# Patient Record
Sex: Female | Born: 1985 | Race: White | Hispanic: No | Marital: Married
Health system: Southern US, Academic
[De-identification: ages and names within clinical notes are randomized; demographics above are authoritative.]

---
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
## 2019-01-27 IMAGING — MR MRI BRAIN WITHOUT AND WITH CONTRAST
12 of 16 series · 36 of 48 positions shown · IV contrast (gadavist)
Comparison: None available.

EXAM:  MRI BRAIN WITHOUT AND WITH CONTRAST
INDICATION: Optic neuritis.
TECHNIQUE: Multiplanar multisequential MRI of the brain and orbits was performed without and with 8 mL of Gadavist.

[Series 11: DWI · axial · 5.0mm · 1.35mm/px · z∈[-41,+85]mm · 9 of 88 slices shown (1 of 3)]
[im 1/88]
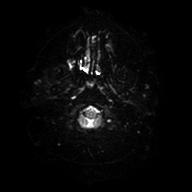
[im 11/88]
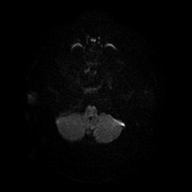
[im 22/88]
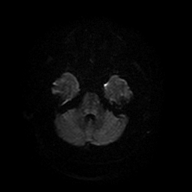
[im 33/88]
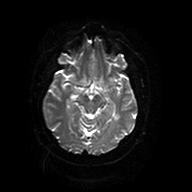
[im 44/88]
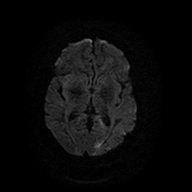
[im 55/88]
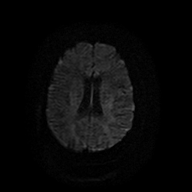
[im 66/88]
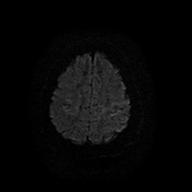
[im 77/88]
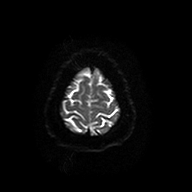
[im 88/88]
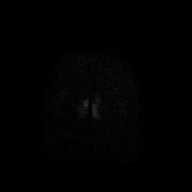

[Series 12: DWI · axial · 5.0mm · 1.35mm/px · z∈[-41,+85]mm · 2 of 22 slices shown (2 of 3)]
[im 1/22]
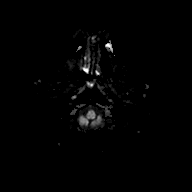
[im 22/22]
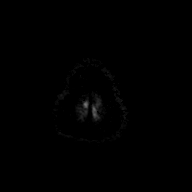

[Series 13: DWI · axial · 5.0mm · 1.35mm/px · z∈[-41,+85]mm · 2 of 22 slices shown (3 of 3)]
[im 1/22]
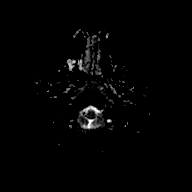
[im 22/22]
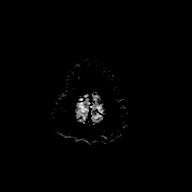

[Series 14: FLAIR · sagittal · 4.0mm · 0.75mm/px · 3 of 26 slices shown (1 of 2)]
[im 1/26]
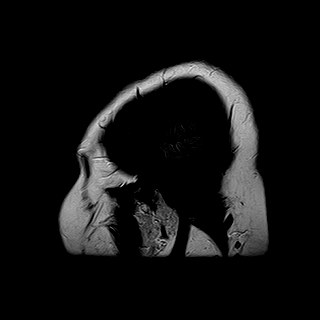
[im 13/26]
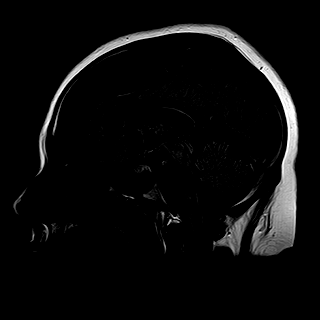
[im 26/26]
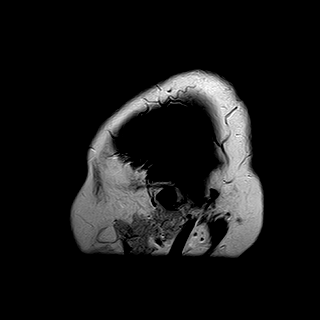

[Series 15: T2 · axial · 5.0mm · 0.43mm/px · z∈[-45,+99]mm · 3 of 25 slices shown]
[im 1/25]
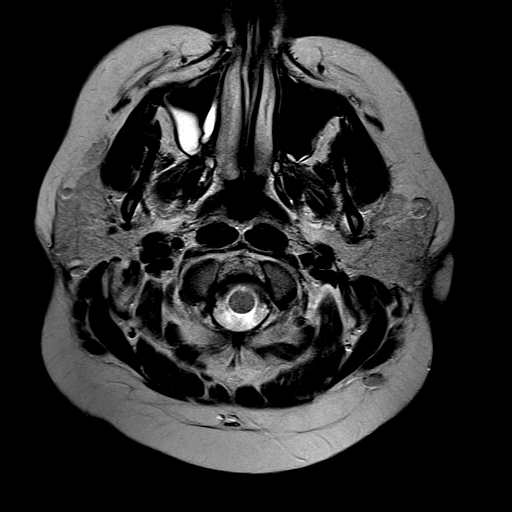
[im 13/25]
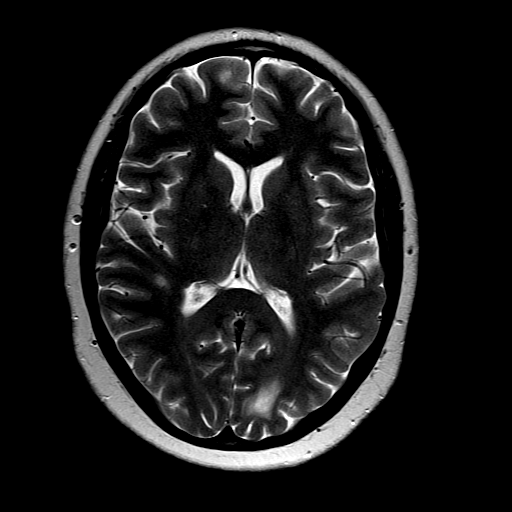
[im 25/25]
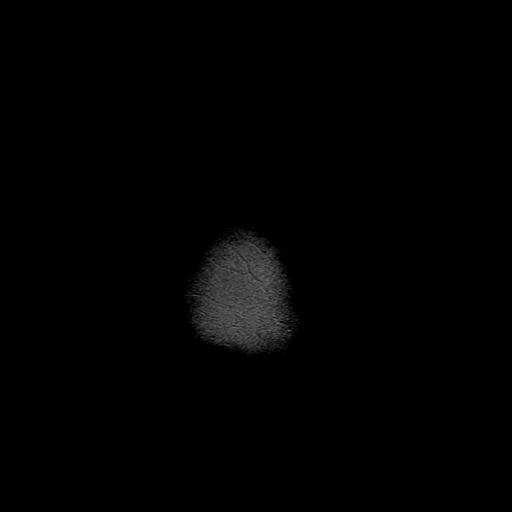

[Series 16: FLAIR · axial · 5.0mm · 0.43mm/px · z∈[-45,+99]mm · 3 of 25 slices shown (2 of 2)]
[im 1/25]
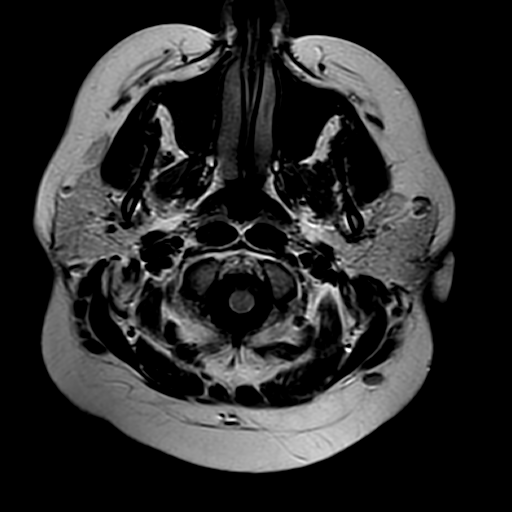
[im 13/25]
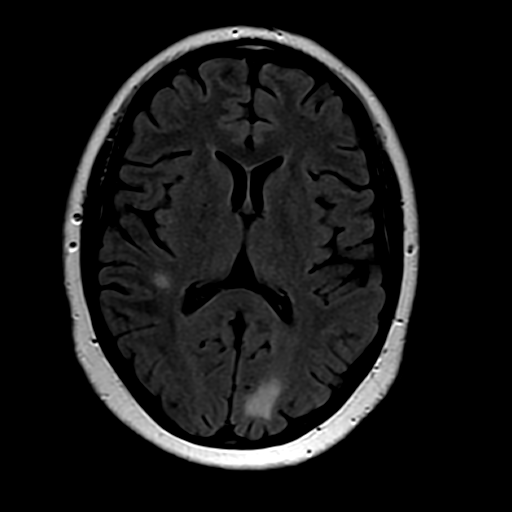
[im 25/25]
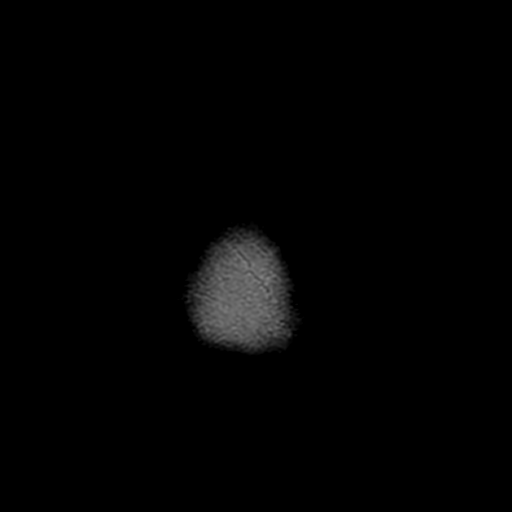

[Series 17: T1 · axial · 5.0mm · 0.43mm/px · z∈[-45,+99]mm · 3 of 25 slices shown]
[im 1/25]
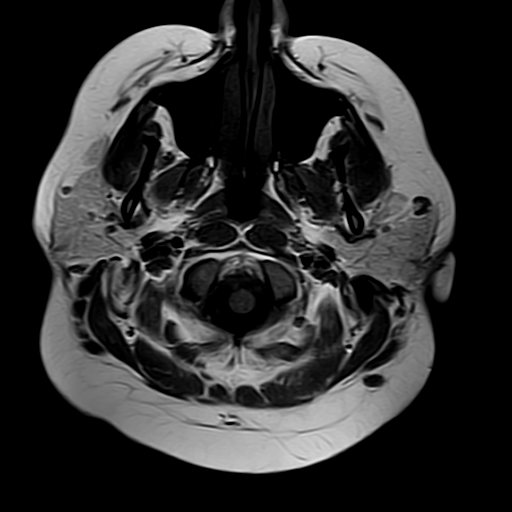
[im 13/25]
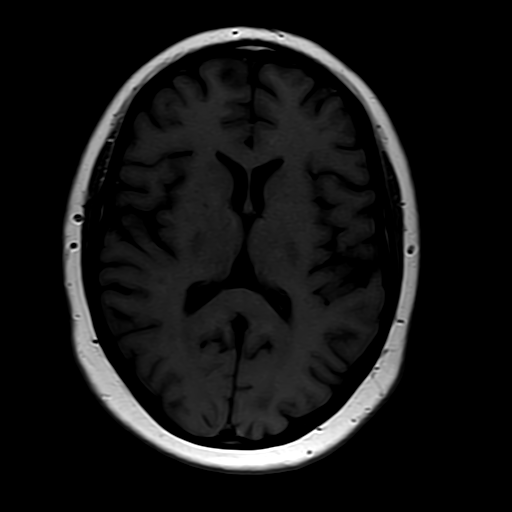
[im 25/25]
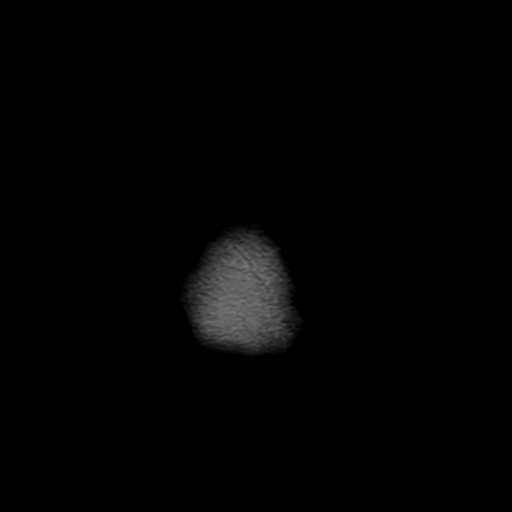

[Series 19: T1 fat-sat · axial · 3.0mm · 0.56mm/px · z∈[-35,+11]mm · 2 of 15 slices shown (1 of 4)]
[im 1/15]
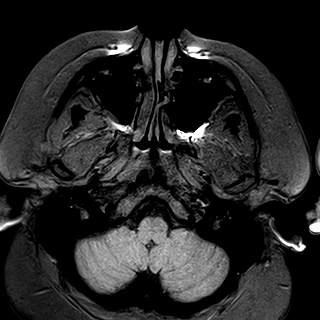
[im 15/15]
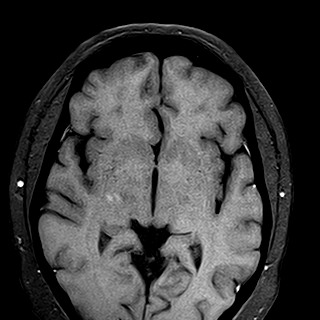

[Series 20: T1 fat-sat · coronal · 3.5mm · 0.56mm/px · 2 of 18 slices shown (2 of 4)]
[im 1/18]
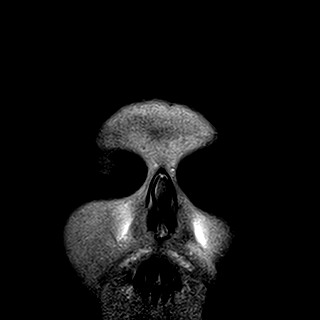
[im 18/18]
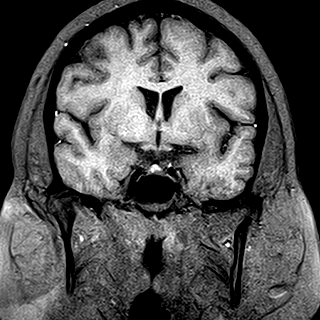

[Series 21: T1 fat-sat · coronal · 6.0mm · 0.57mm/px · 3 of 24 slices shown (3 of 4)]
[im 1/24]
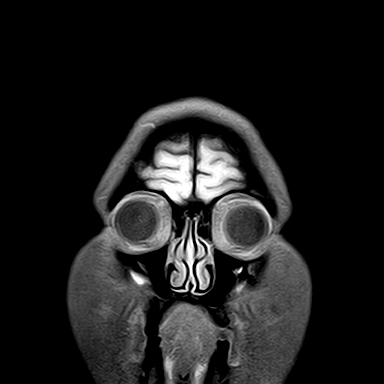
[im 12/24]
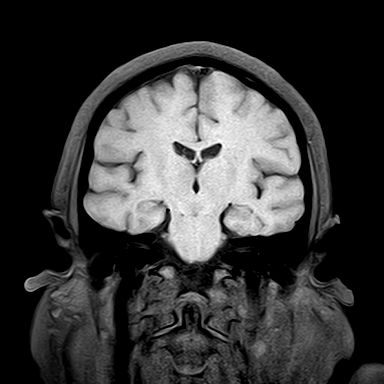
[im 24/24]
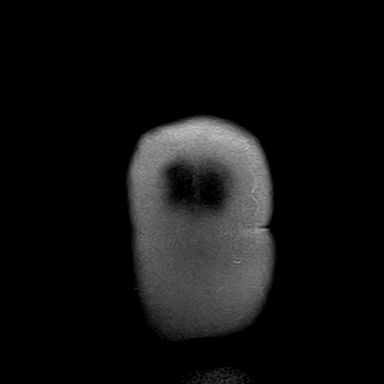

[Series 22: T1 fat-sat · axial · 5.0mm · 0.43mm/px · z∈[-45,+99]mm · 3 of 25 slices shown (4 of 4)]
[im 1/25]
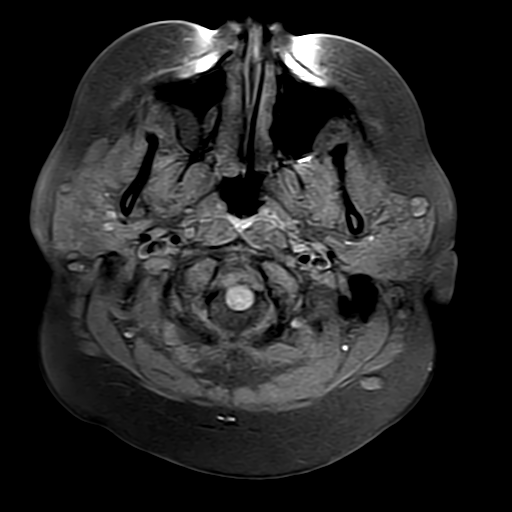
[im 13/25]
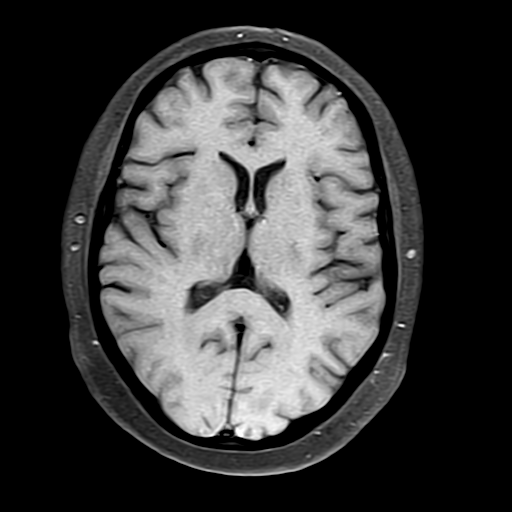
[im 25/25]
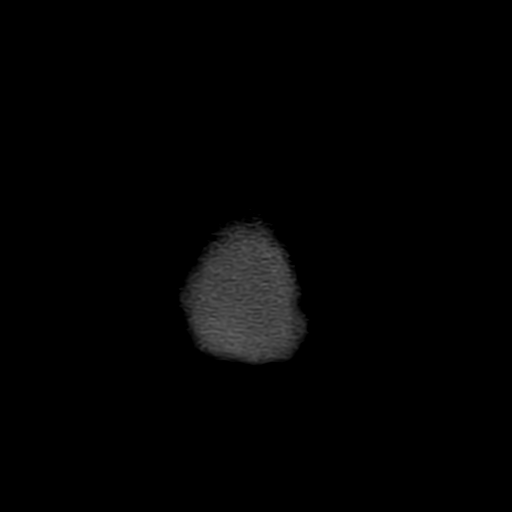

[Series 23: T1 fat-sat post-contrast · axial · 3.0mm · 0.56mm/px · 1 of 15 slices shown]
[im 1/15]
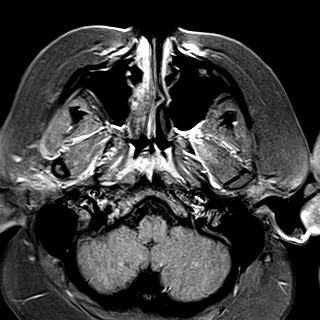

[36 of 48 positions shown; findings below may reference images not displayed]

FINDINGS: Ventricular and sulcal size is normal for the patient's age. There are approximately 6-8 foci of abnormal T2/FLAIR signal hyperintensity within the periventricular and subcortical deep white matter of each cerebral hemisphere. A 3.7 mm lesion is also noted within the medulla, slightly to the left of the midline. Largest lesion measures 2 cm within the subcortical left occipital lobe with mild diffusion restriction and enhancement. Faint enhancement is also noted within a 4.5 mm lesion within the superior right frontal lobe. There is no mass effect, midline shift or intracranial hemorrhage. There is no evidence of acute infarction or prior microhemorrhages. Skull base flow voids and basal cisterns are patent. Sagittal survey of midline structures is unremarkable. Orbital contents are normal. Faint enhancement of the left optic nerve, close to the optic radiation, is also seen. There are no extra-axial fluid collections. Visualized paranasal sinuses and mastoid air cells are also unremarkable.
IMPRESSION: Imaging findings compatible with multiple sclerosis. There is evidence of active demyelination and left-sided optic neuritis.

## 2019-06-23 IMAGING — MR MRI CERVICAL SPINE WITHOUT AND WITH CONTRAST
5 of 8 series · 30 of 48 positions shown · IV contrast (gadavist)
Comparison: None available.

EXAM:  MRI CERVICAL SPINE WITHOUT AND WITH CONTRAST
INDICATION: Multiple sclerosis.
TECHNIQUE: Multiplanar multisequential MRI of the cervical spine was performed without and with 8 mL of Gadavist.

[Series 10: T2 · sagittal · 3.0mm · 0.75mm/px · 5 of 13 slices shown (1 of 2)]
[im 1/13]
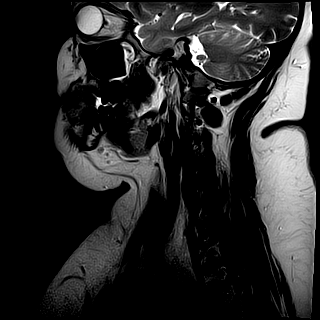
[im 4/13]
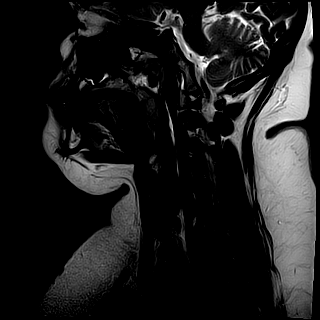
[im 7/13]
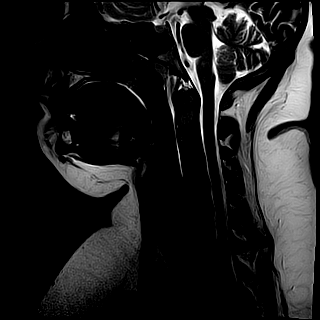
[im 10/13]
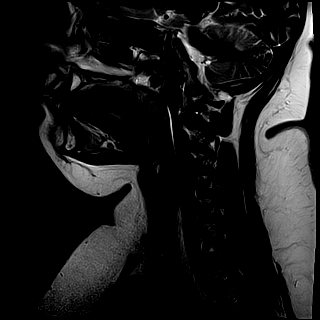
[im 13/13]
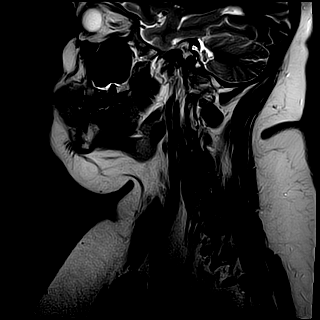

[Series 11: T1 · sagittal · 3.0mm · 0.47mm/px · 4 of 13 slices shown]
[im 1/13]
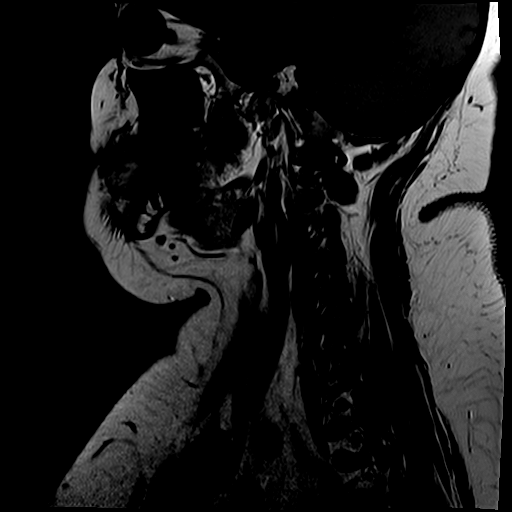
[im 5/13]
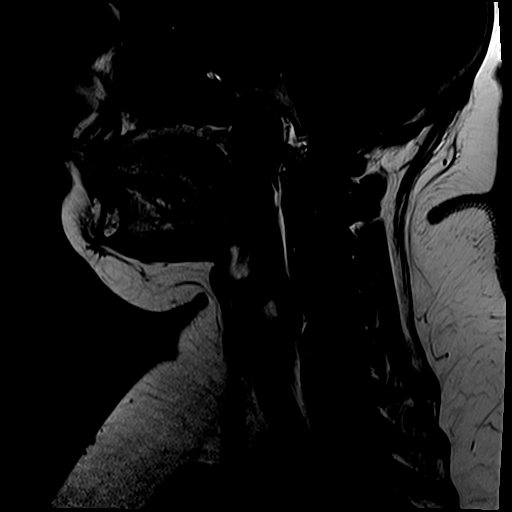
[im 9/13]
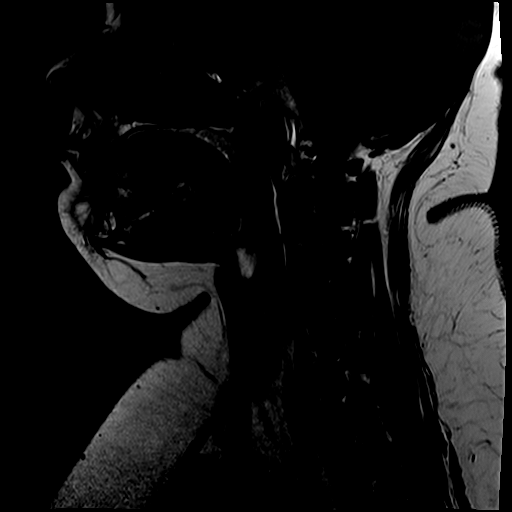
[im 13/13]
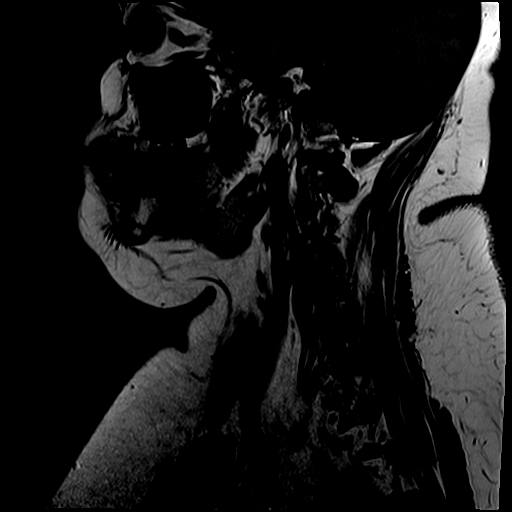

[Series 12: T1 fat-sat · sagittal · 3.0mm · 0.75mm/px · 4 of 13 slices shown (1 of 2)]
[im 1/13]
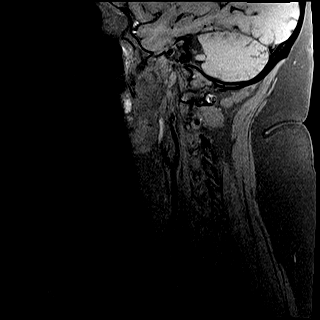
[im 5/13]
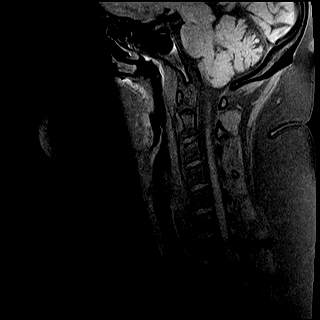
[im 9/13]
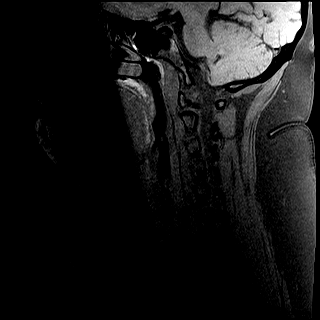
[im 13/13]
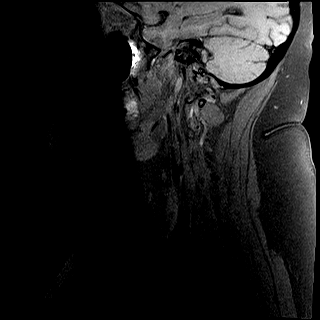

[Series 14: T2 · axial · 3.5mm · 0.35mm/px · z∈[-95,+17]mm · 9 of 26 slices shown (2 of 2)]
[im 1/26]
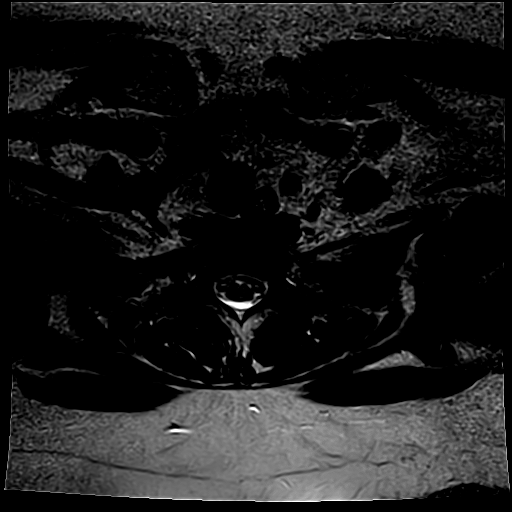
[im 4/26]
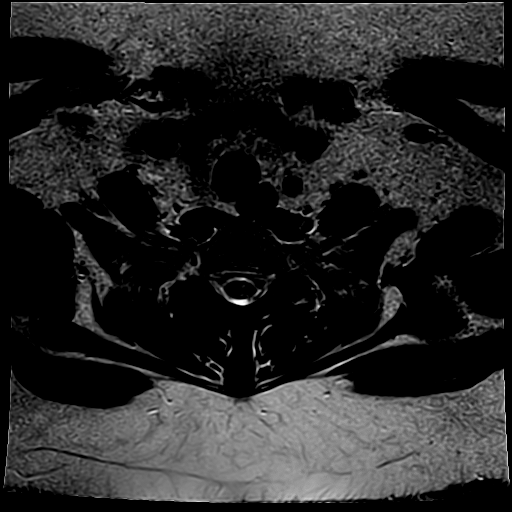
[im 7/26]
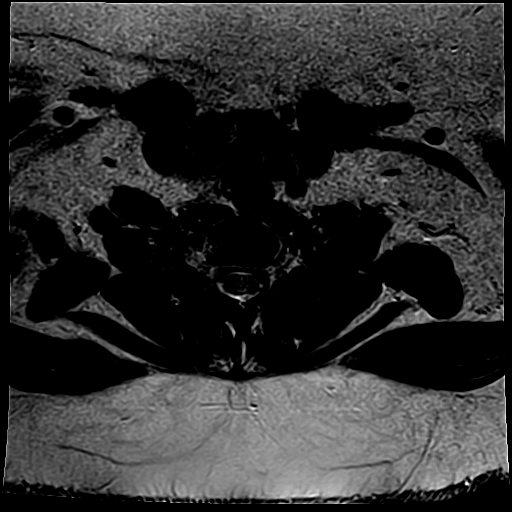
[im 10/26]
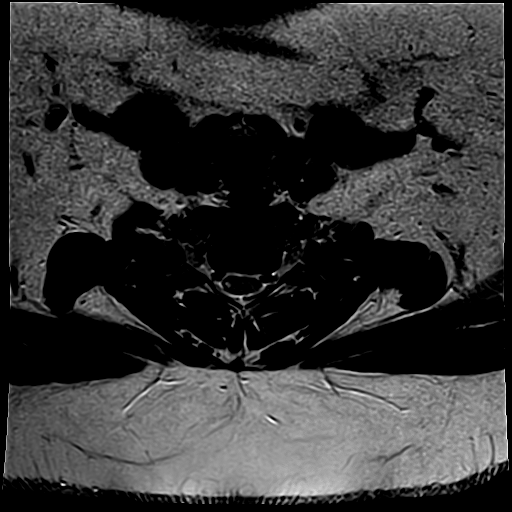
[im 13/26]
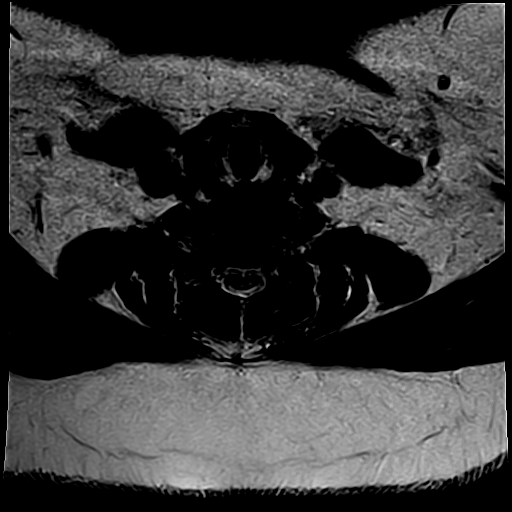
[im 16/26]
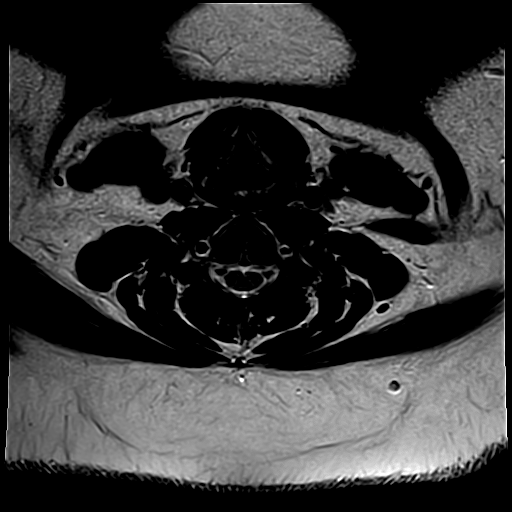
[im 19/26]
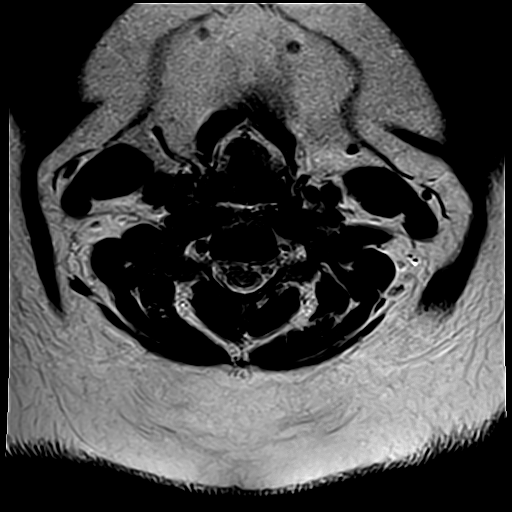
[im 22/26]
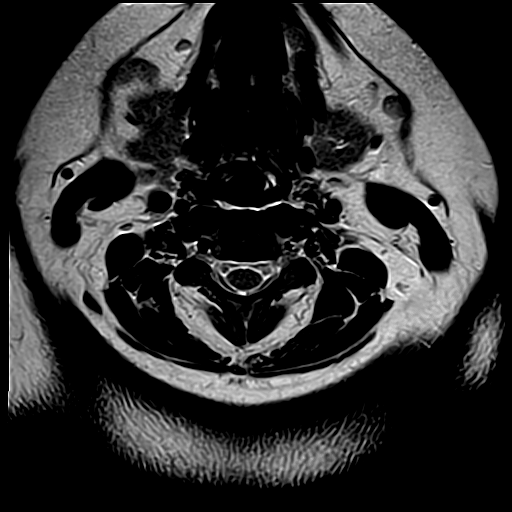
[im 26/26]
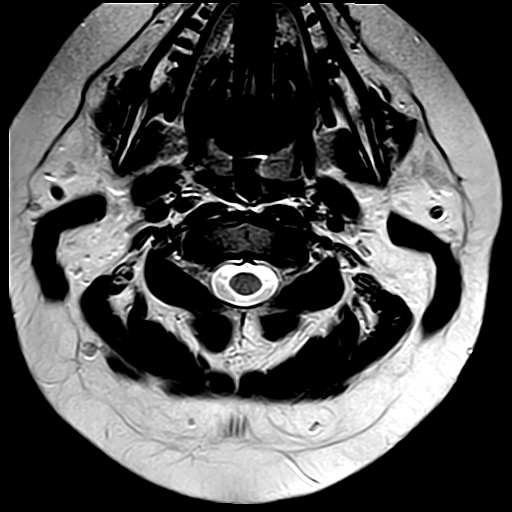

[Series 15: T1 fat-sat · axial · 3.5mm · 0.70mm/px · z∈[-95,+17]mm · 8 of 26 slices shown (2 of 2)]
[im 1/26]
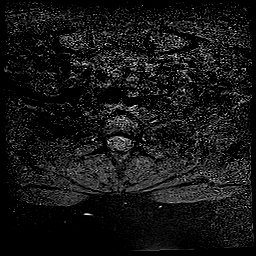
[im 4/26]
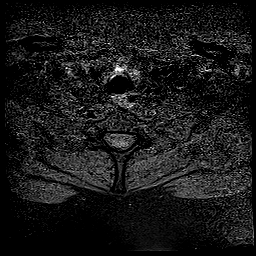
[im 7/26]
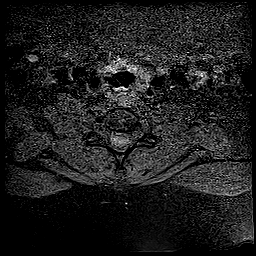
[im 10/26]
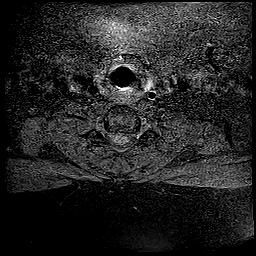
[im 16/26]
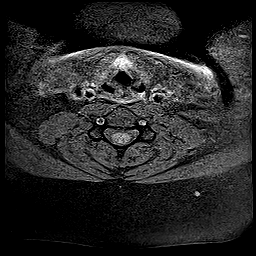
[im 19/26]
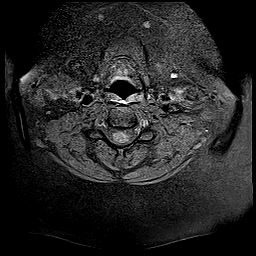
[im 22/26]
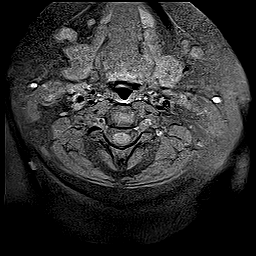
[im 26/26]
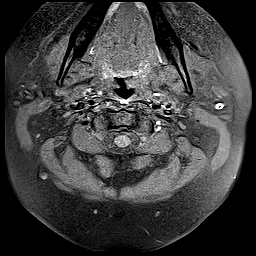

[30 of 48 positions shown; findings below may reference images not displayed]

FINDINGS: Straightening of cervical lordosis is likely positional. Bone marrow signal intensity is normal. There is no acute fracture or subluxation. 

There is a 5.5 mm faint T2 signal hyperintense lesion within the spinal cord at the level of C2 vertebral body. This is best seen on the STIR sequence. There is another 6.5 mm faintly enhancing lesion within the right aspect of the spinal cord at the level of C6 vertebral body. There is no significant disc herniation, spinal canal or neural foraminal stenosis at any level. Paraspinal soft tissues are unremarkable.
IMPRESSION: Two small lesions within the cervical spinal cord as detailed above. There is faint enhancement within a 6.5 mm plaque at the level of C6 vertebral body, suggestive of active demyelination. No significant degenerative changes.

## 2019-06-23 IMAGING — MR MRI THORACIC SPINE WITHOUT AND WITH CONTRAST
6 of 10 series · 30 of 48 positions shown · IV contrast (gadavist)
Comparison: None available.

EXAM:  MRI THORACIC SPINE WITHOUT AND WITH CONTRAST
INDICATION: Multiple sclerosis.
TECHNIQUE: Multiplanar multisequential MRI of the thoracic spine was performed without and with 8 mL of Gadavist.

[Series 6: T2 · sagittal · 5.0mm · 0.78mm/px · 5 of 11 slices shown (1 of 2)]
[im 1/11]
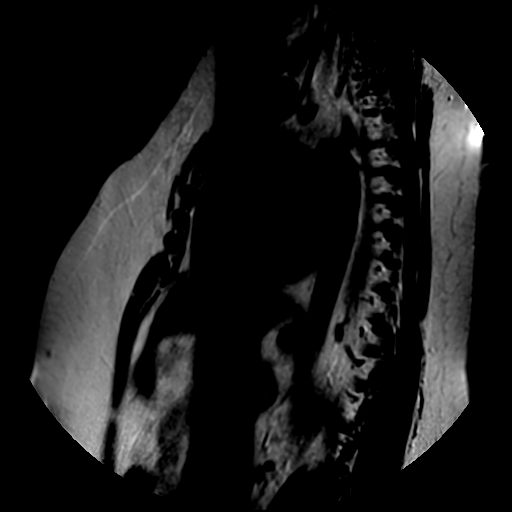
[im 3/11]
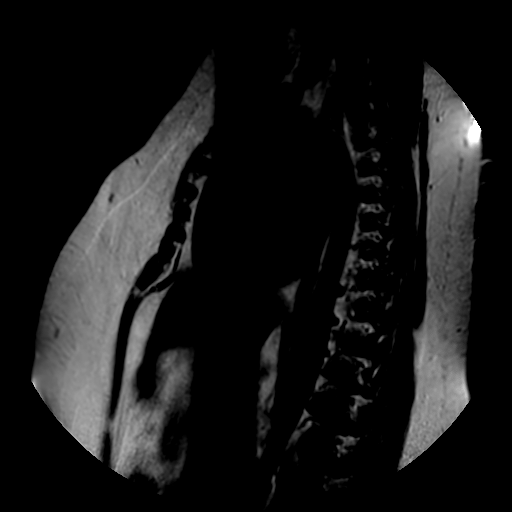
[im 6/11]
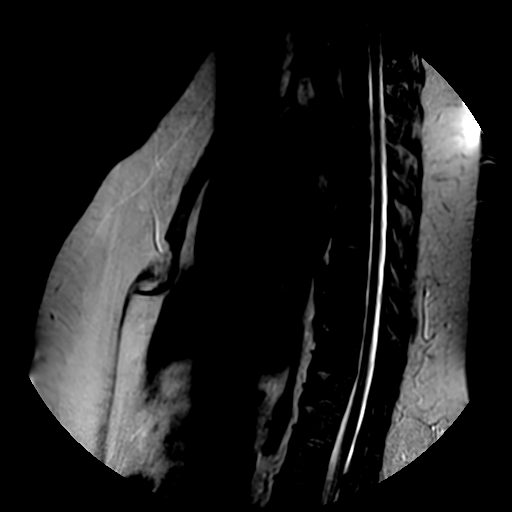
[im 8/11]
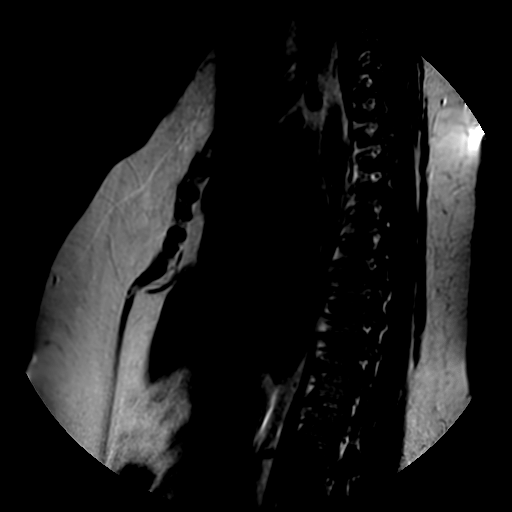
[im 11/11]
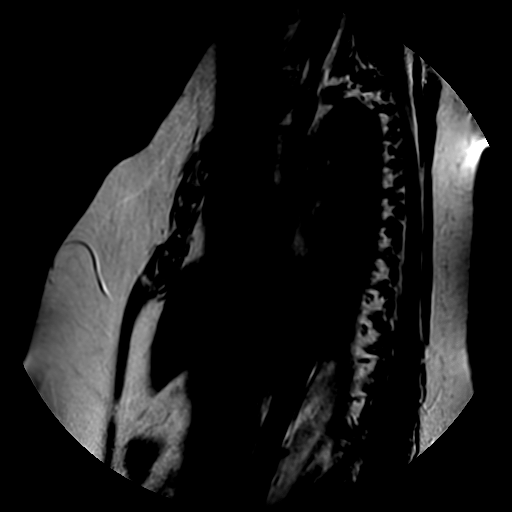

[Series 7: T1 · sagittal · 5.0mm · 0.78mm/px · 5 of 11 slices shown (1 of 2)]
[im 1/11]
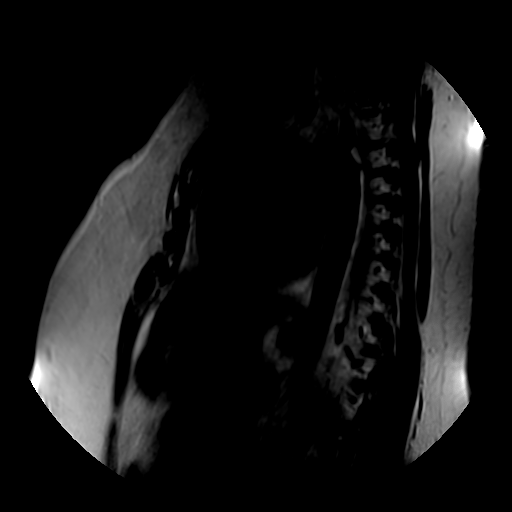
[im 3/11]
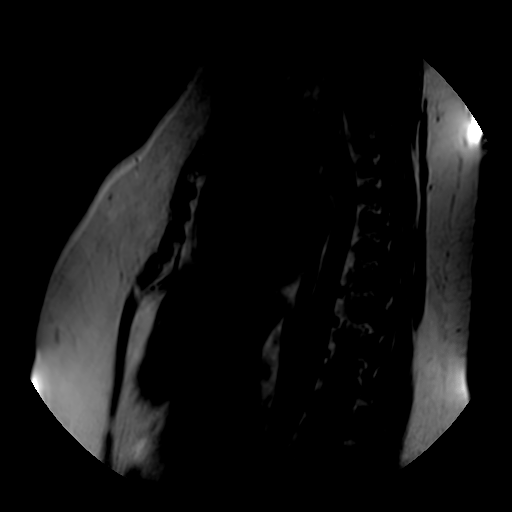
[im 6/11]
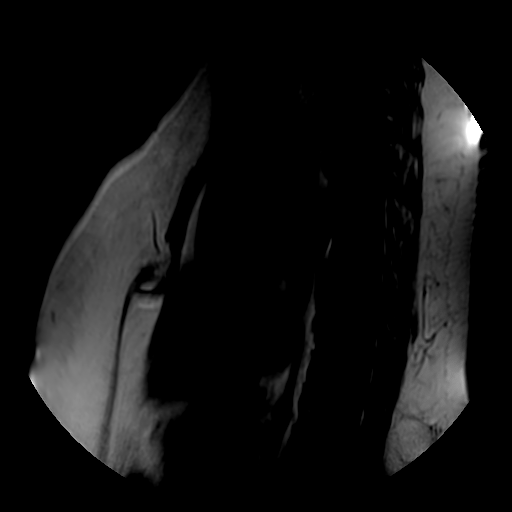
[im 8/11]
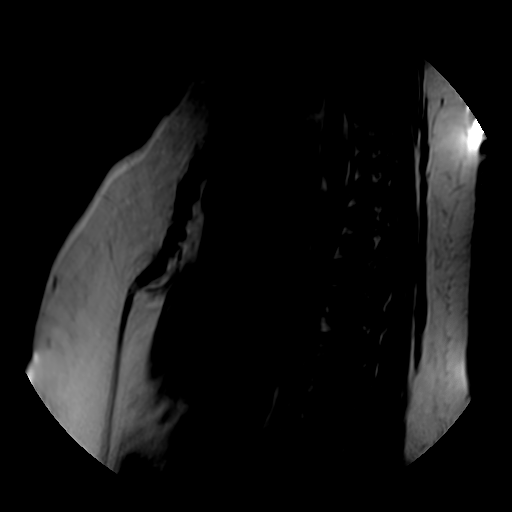
[im 11/11]
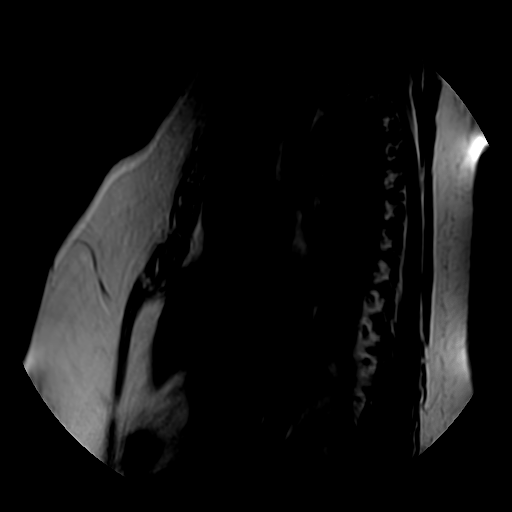

[Series 9: T1 fat-sat · sagittal · 5.0mm · 1.56mm/px · 5 of 11 slices shown (1 of 2)]
[im 1/11]
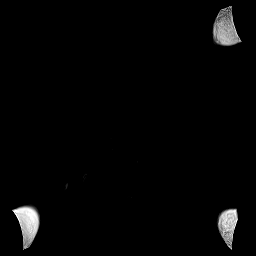
[im 3/11]
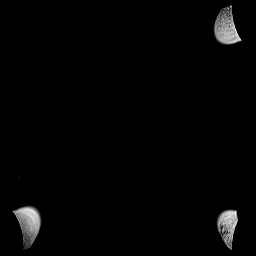
[im 6/11]
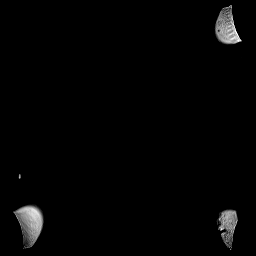
[im 8/11]
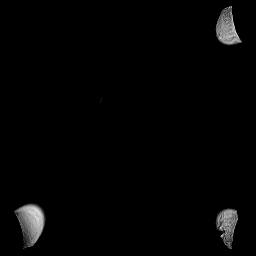
[im 11/11]
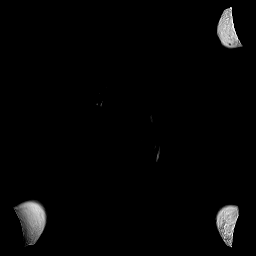

[Series 11: T2 · axial · 5.0mm · 0.45mm/px · z∈[-122,+116]mm · 6 of 12 slices shown (2 of 2)]
[im 1/12]
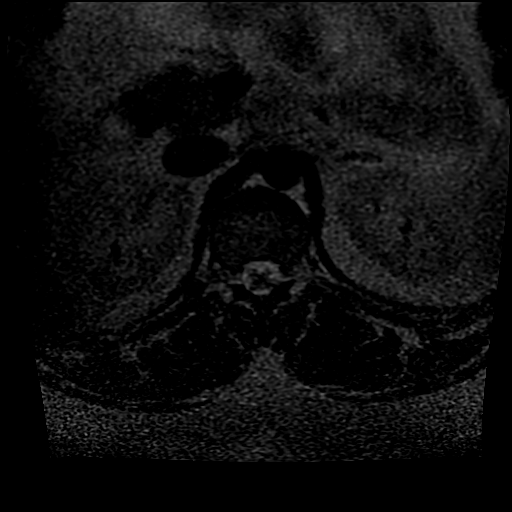
[im 3/12]
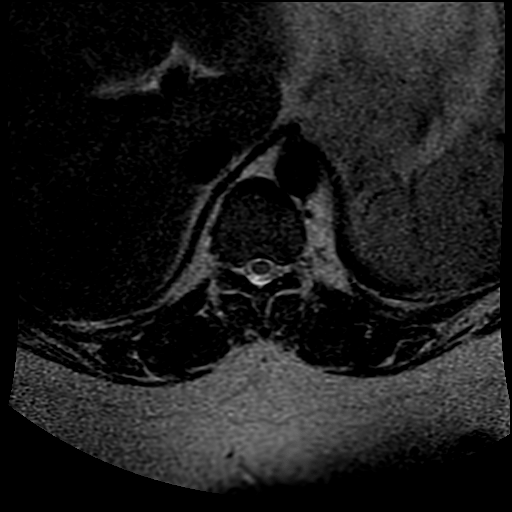
[im 5/12]
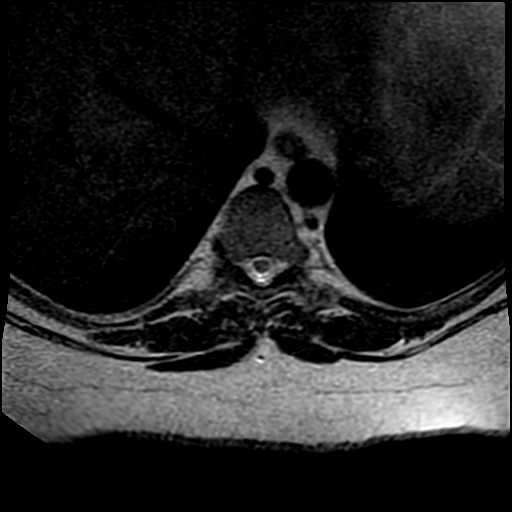
[im 7/12]
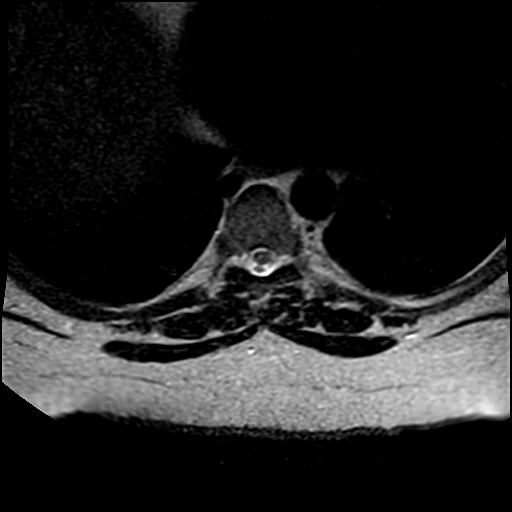
[im 9/12]
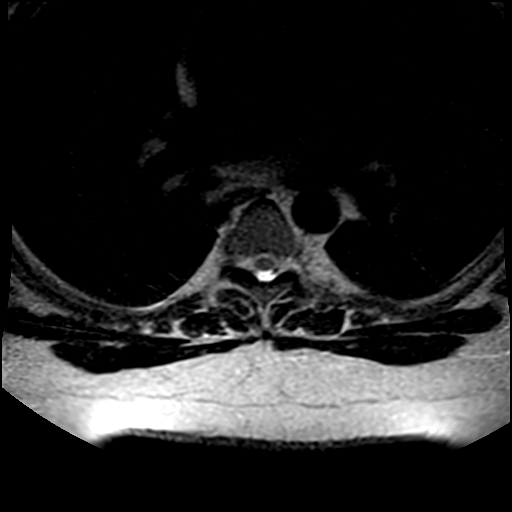
[im 12/12]
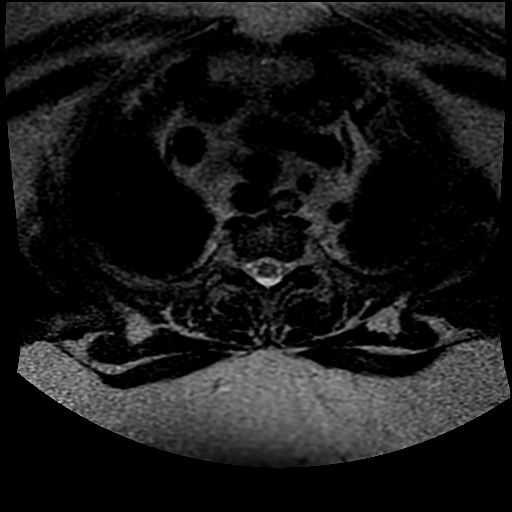

[Series 12: T1 fat-sat · axial · 5.0mm · 1.03mm/px · z∈[-122,+116]mm · 6 of 12 slices shown (2 of 2)]
[im 1/12]
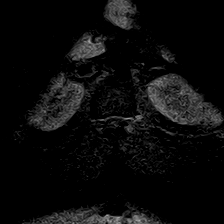
[im 3/12]
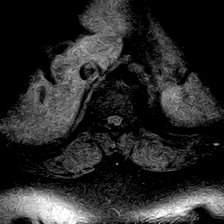
[im 5/12]
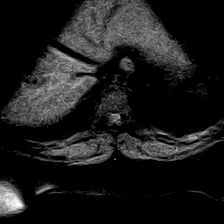
[im 7/12]
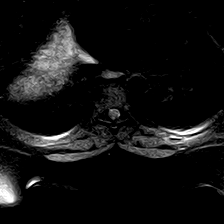
[im 9/12]
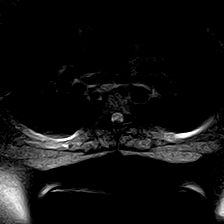
[im 12/12]
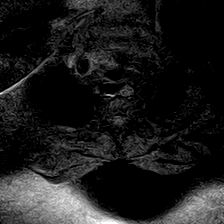

[Series 18: T1 · sagittal · 5.0mm · 1.56mm/px · 3 of 11 slices shown (2 of 2)]
[im 1/11]
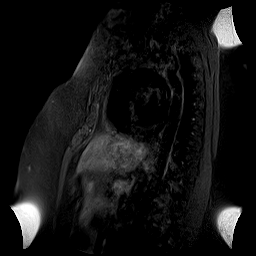
[im 3/11]
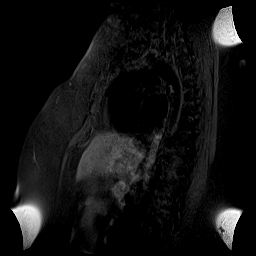
[im 6/11]
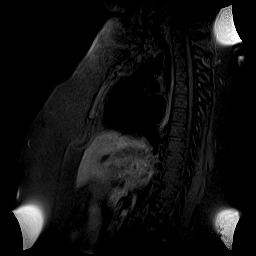

[30 of 48 positions shown; findings below may reference images not displayed]

FINDINGS: Examination is limited due to patient's large body habitus. Vertebral bodies are normal in height, alignment and signal intensity. There is no acute fracture or subluxation. Visualized spinal cord also appears to be normal in signal intensity without evidence of demyelinating disease or compression at any level.

There is no significant disc herniation, spinal canal or neural foraminal stenosis at any level. Following intravenous contrast administration, there is no abnormal spinal cord or paraspinal soft tissue enhancement. Paraspinal soft tissues are also unremarkable. There is no pleural effusion.
IMPRESSION: Unremarkable limited exam.

## 2020-05-25 IMAGING — MR MRI BRAIN WITHOUT AND WITH CONTRAST
8 of 14 series · 25 of 48 positions shown · IV contrast (gadavist)
Comparison: 01/27/2019.

﻿EXAM:  [id number]   MRI BRAIN WITHOUT AND WITH CONTRAST
INDICATION: Multiple sclerosis.
TECHNIQUE: Pre and post contrast multiplanar, multi sequence MRI was performed.  6 cc Gadavist IV.

[Series 6: DWI · axial · 5.0mm · 1.35mm/px · z∈[-28,+98]mm · 2 of 22 slices shown (1 of 2)]
[im 1/22]
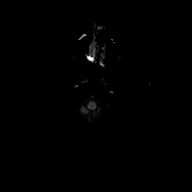
[im 22/22]
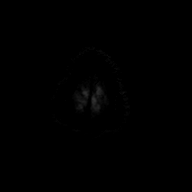

[Series 7: DWI · axial · 5.0mm · 1.35mm/px · z∈[-28,+98]mm · 2 of 22 slices shown (2 of 2)]
[im 1/22]
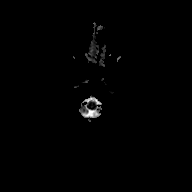
[im 22/22]
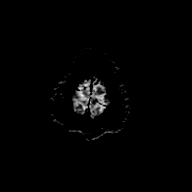

[Series 8: FLAIR · sagittal · 5.0mm · 0.75mm/px · 3 of 32 slices shown (1 of 2)]
[im 1/32]
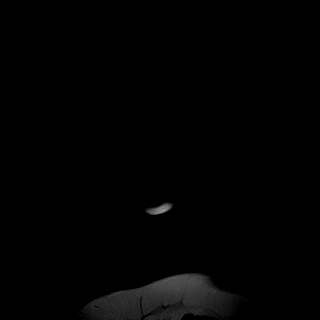
[im 16/32]
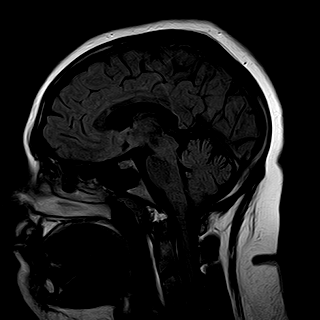
[im 32/32]
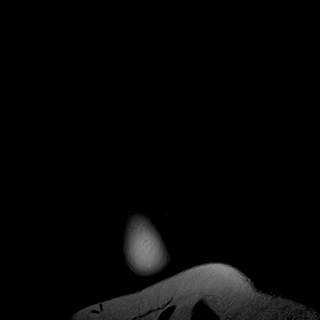

[Series 12: T1 · axial · 4.0mm · 0.43mm/px · z∈[-37,+117]mm · 4 of 36 slices shown]
[im 1/36]
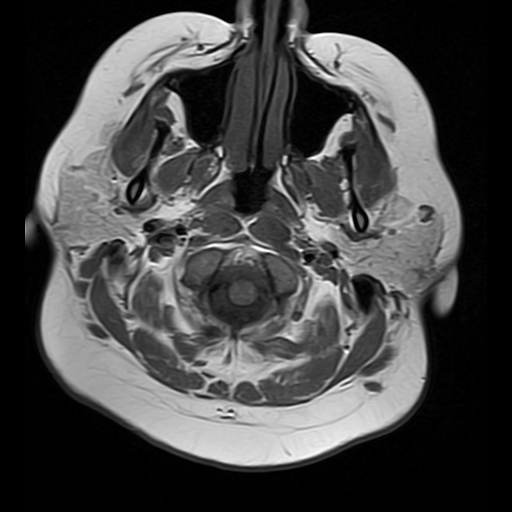
[im 12/36]
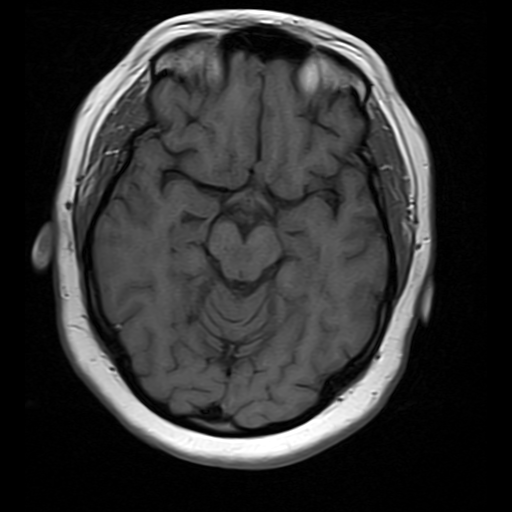
[im 24/36]
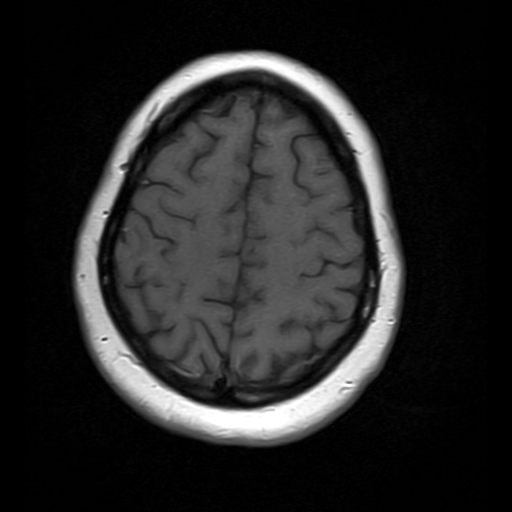
[im 36/36]
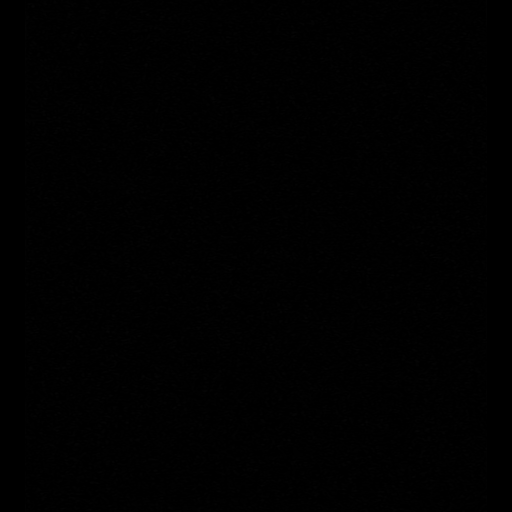

[Series 13: T2 · coronal · 5.0mm · 0.43mm/px · 3 of 28 slices shown]
[im 1/28]
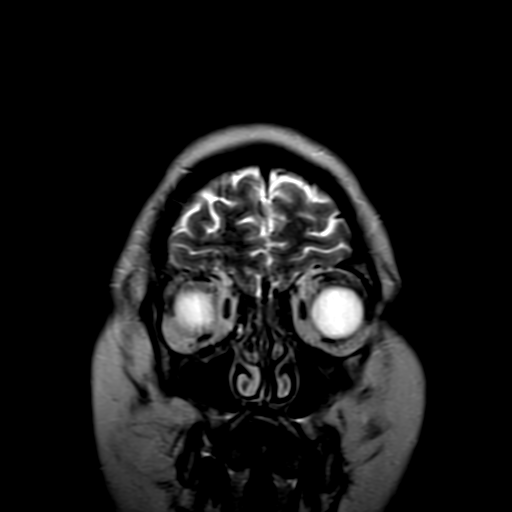
[im 14/28]
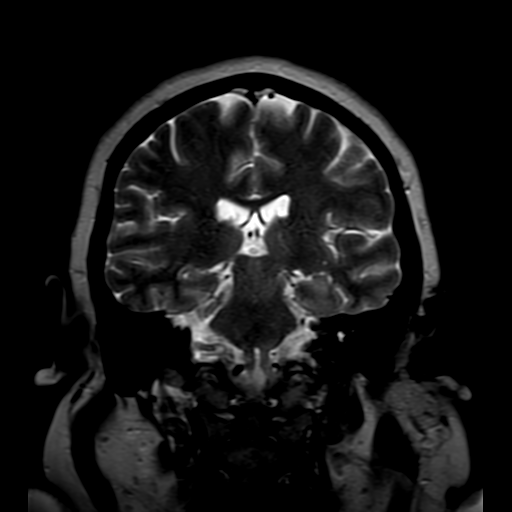
[im 28/28]
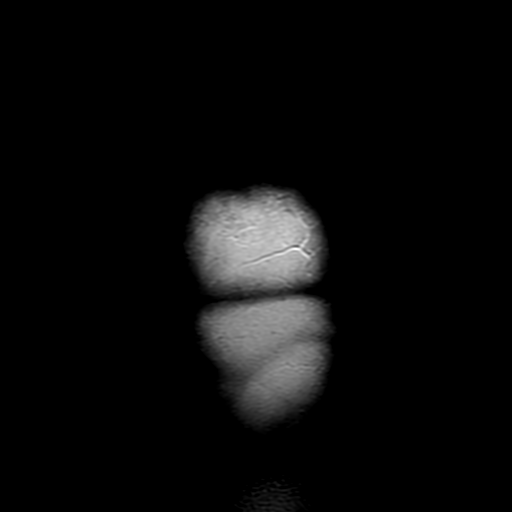

[Series 14: T1 fat-sat · coronal · 5.0mm · 0.57mm/px · 3 of 28 slices shown]
[im 1/28]
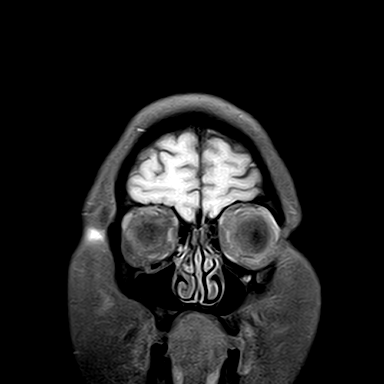
[im 14/28]
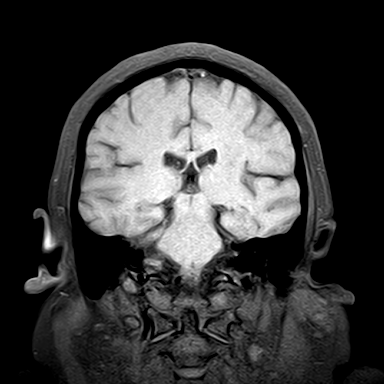
[im 28/28]
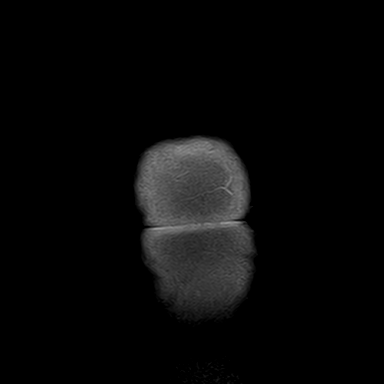

[Series 16: FLAIR · axial · 4.0mm · 0.43mm/px · z∈[-37,+117]mm · 4 of 36 slices shown (2 of 2)]
[im 1/36]
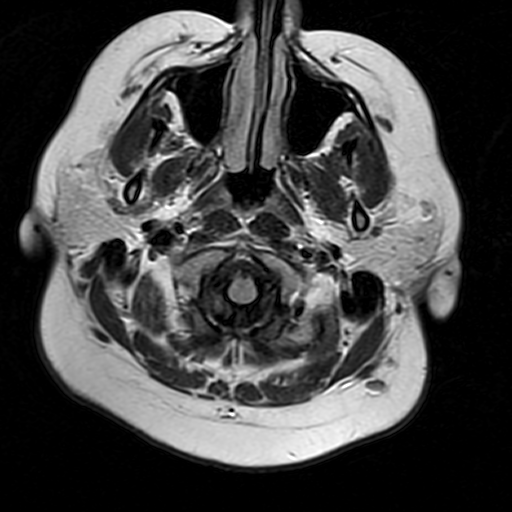
[im 12/36]
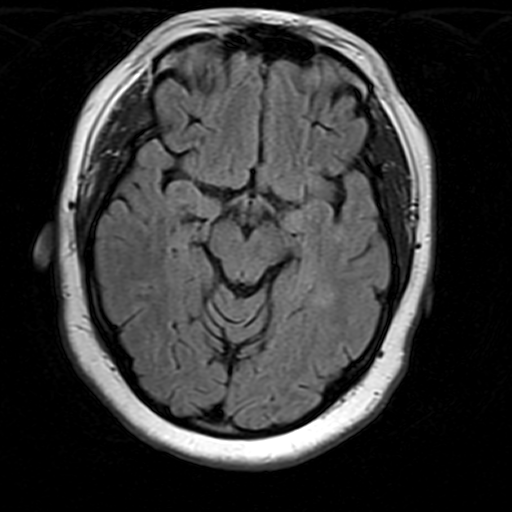
[im 24/36]
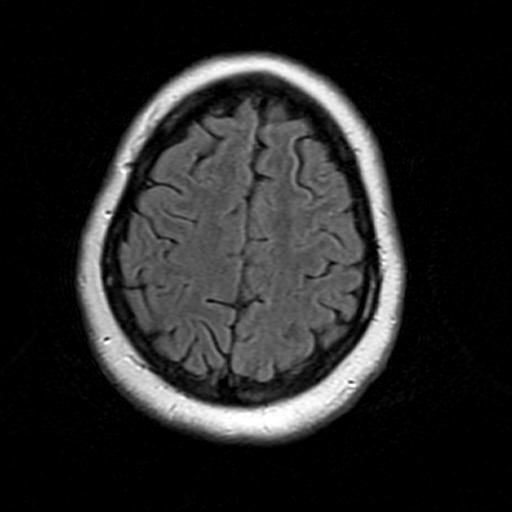
[im 36/36]
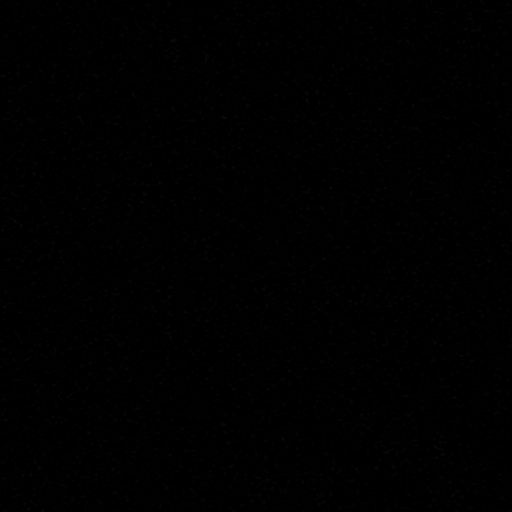

[Series 19: T1 post-contrast · axial · 4.0mm · 0.43mm/px · z∈[-37,+117]mm · 4 of 36 slices shown]
[im 1/36]
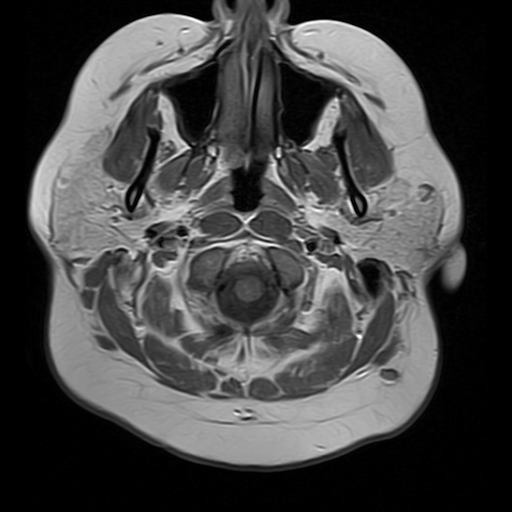
[im 12/36]
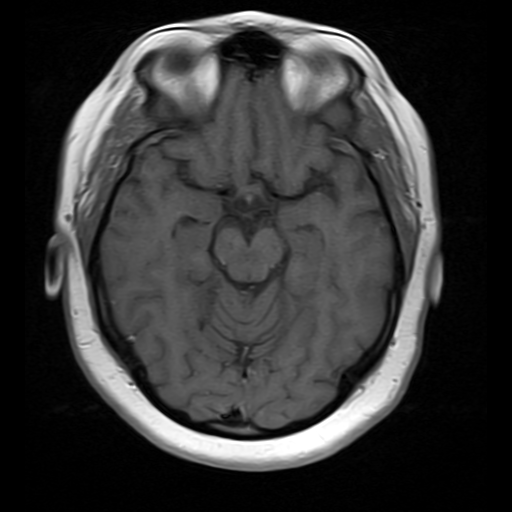
[im 24/36]
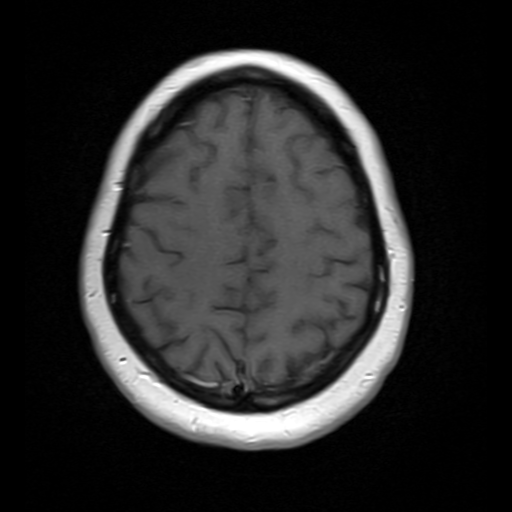
[im 36/36]
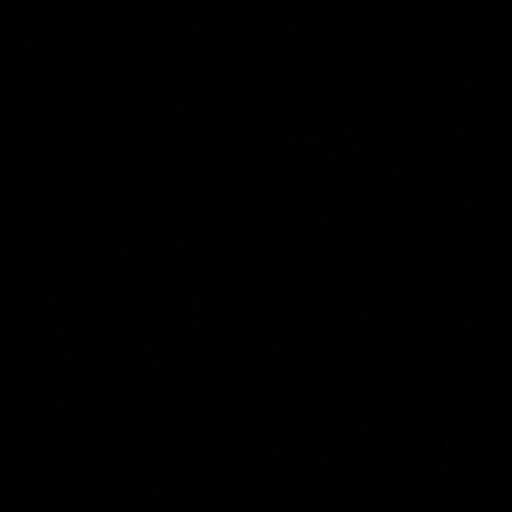

[25 of 48 positions shown; findings below may reference images not displayed]

FINDINGS: There is multifocal white matter disease compatible with multiple sclerosis.  However, the number and size of these multiple lesions has decreased compared to the prior exam dated 01/27/2019.  

There is no abnormal enhancement on the current examination to suggest active demyelinating disease. 

The previously noted left optic neuritis is not apparent on the current examination.  

There is a subtle, small, faint lesion within the spinal cord at the level of C2 that was described previously.  However, this lesion was not well seen on the cervical spine MRI performed on the same day as the current examination.  This probable reflects body coil imaging of the cervical spine that reduces image quality and conspicuity.  

There is no mass effect, shift of the midline structures, or extra-axial collection.  The paranasal sinuses and mastoid air cells appear clear.
IMPRESSION: 1. Findings compatible with multiple sclerosis with moderate improvement compared to 01/27/2019.

## 2020-05-25 IMAGING — MR MRI CERVICAL SPINE WITHOUT AND WITH CONTRAST
6 of 8 series · 33 of 48 positions shown · IV contrast (gadavist)
Comparison: 06/23/2019.

﻿EXAM:  [id number]   MRI CERVICAL SPINE WITHOUT AND WITH CONTRAST
INDICATION: Multiple sclerosis.
TECHNIQUE: Pre and post contrast multiplanar, multi sequence MRI of the cervical spine was performed.  The patient’s large size required use of the body coil with resultant degradation in imaging quality and limitation of this examination.  6 cc Gadavist IV.

[Series 5: T2 · sagittal · 3.0mm · 0.75mm/px · 5 of 13 slices shown (1 of 2)]
[im 1/13]
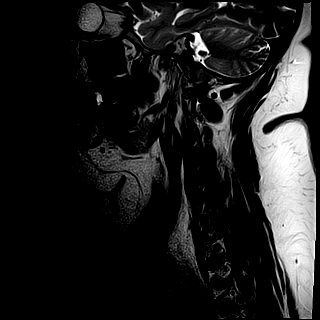
[im 4/13]
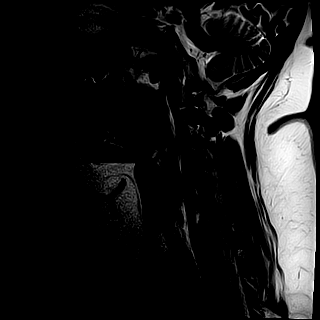
[im 7/13]
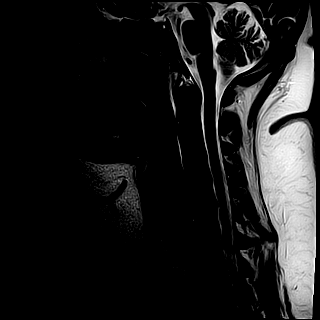
[im 10/13]
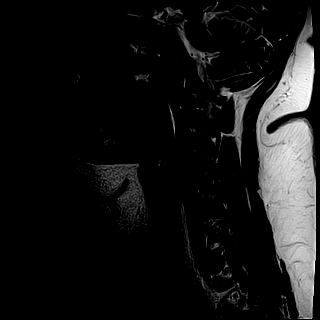
[im 13/13]
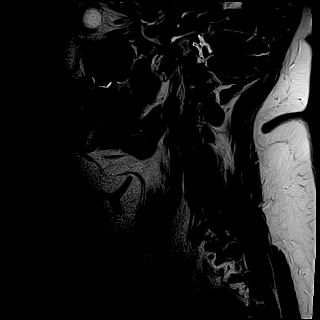

[Series 6: T1 · sagittal · 3.0mm · 0.47mm/px · 4 of 13 slices shown]
[im 1/13]
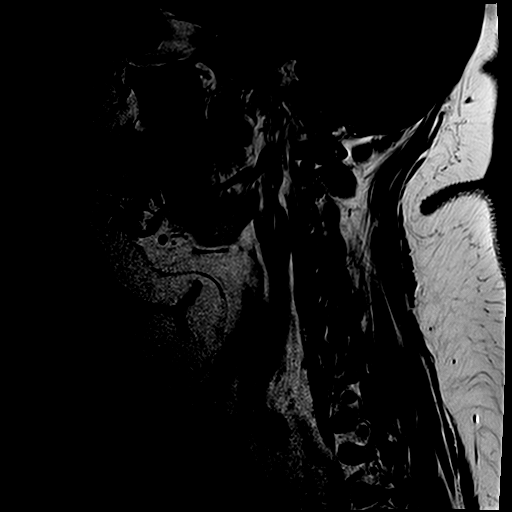
[im 5/13]
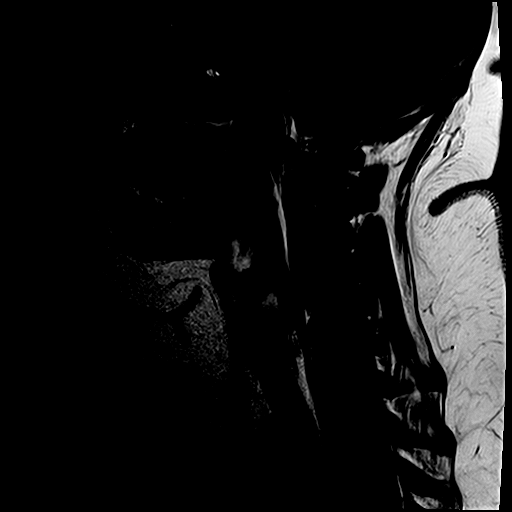
[im 9/13]
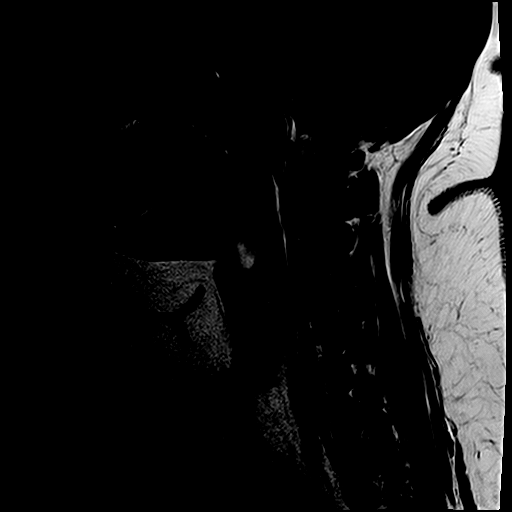
[im 13/13]
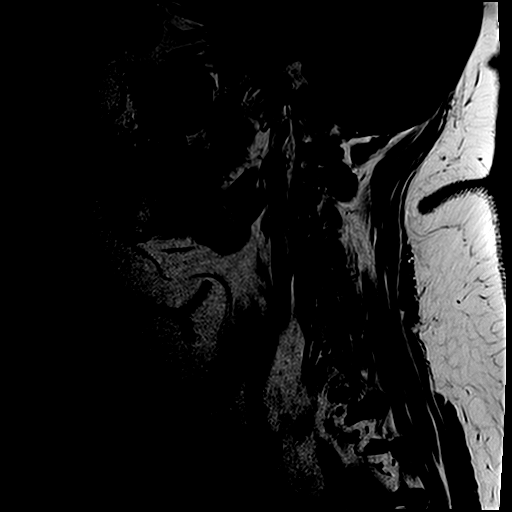

[Series 9: T2 · axial · 4.0mm · 0.35mm/px · z∈[-87,+37]mm · 9 of 26 slices shown (2 of 2)]
[im 1/26]
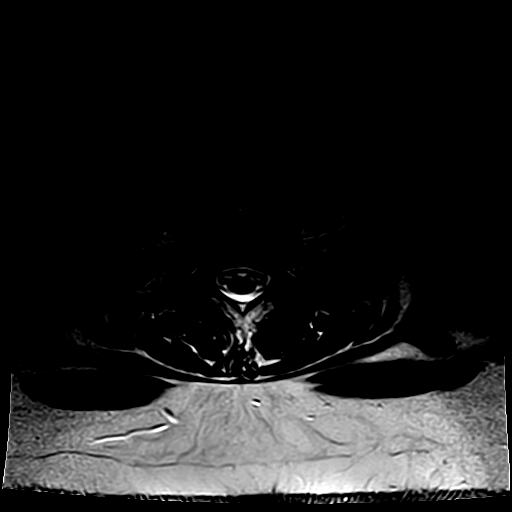
[im 4/26]
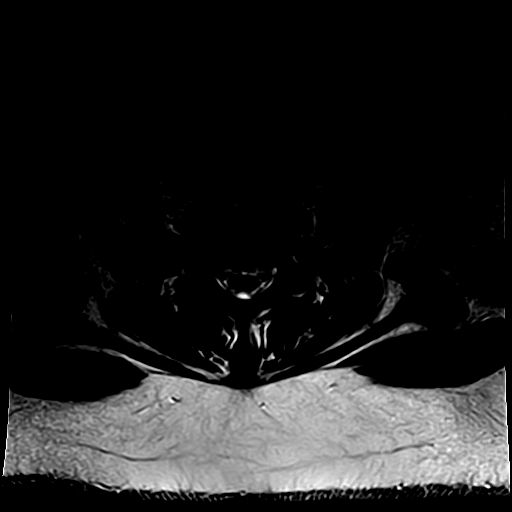
[im 7/26]
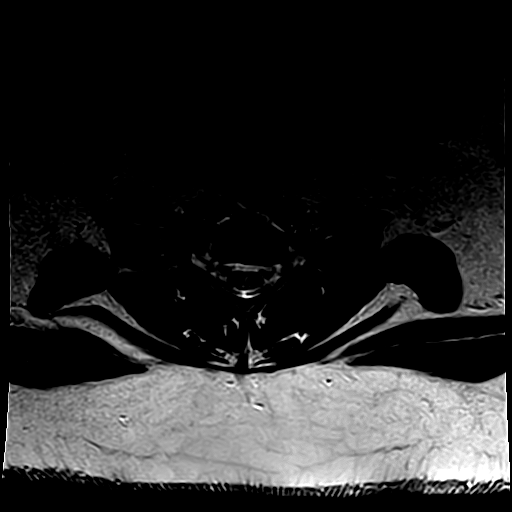
[im 10/26]
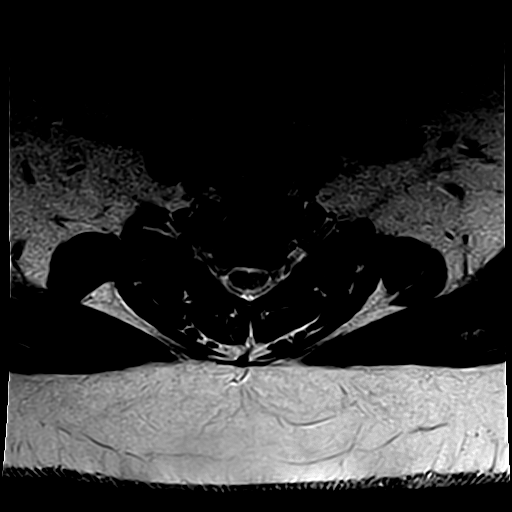
[im 13/26]
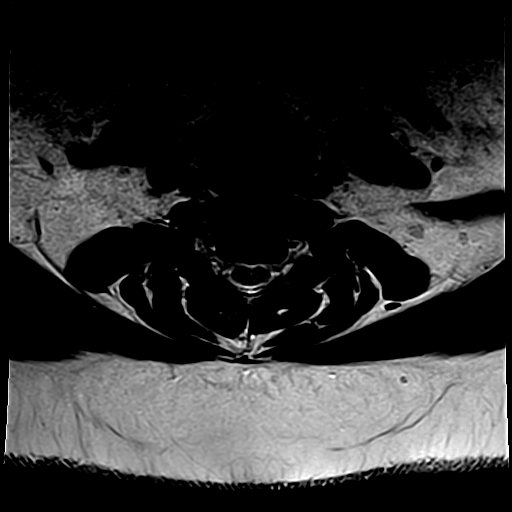
[im 16/26]
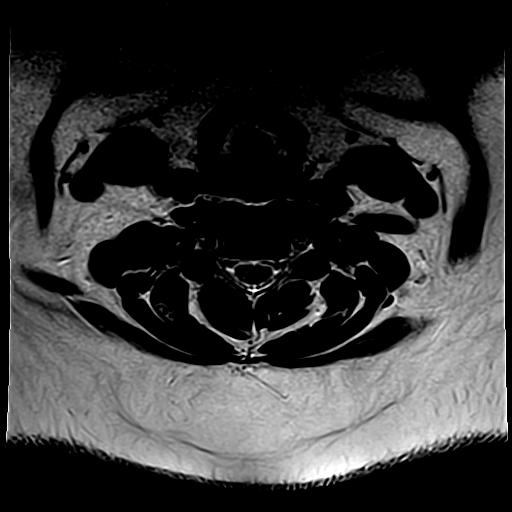
[im 19/26]
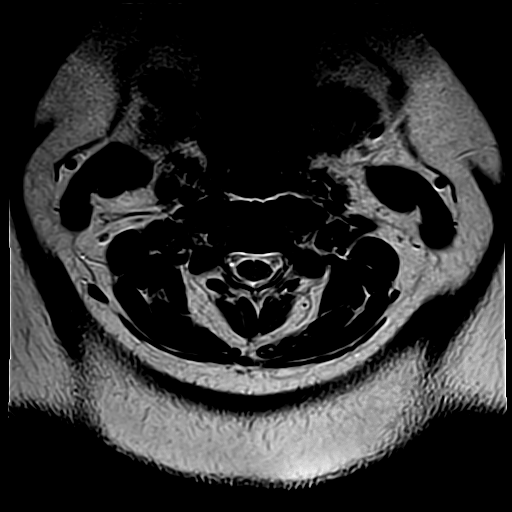
[im 22/26]
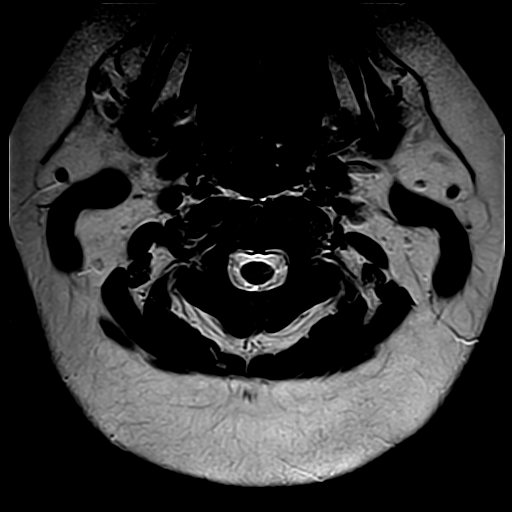
[im 26/26]
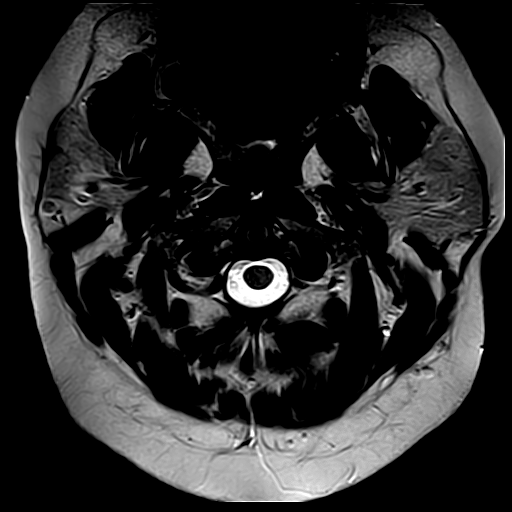

[Series 10: T1 fat-sat · axial · 4.0mm · 0.70mm/px · z∈[-87,+37]mm · 9 of 26 slices shown (1 of 2)]
[im 1/26]
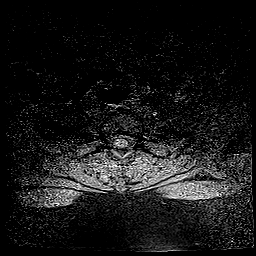
[im 4/26]
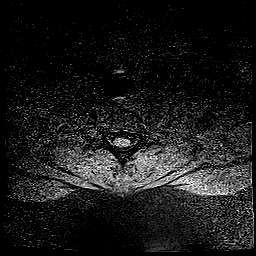
[im 7/26]
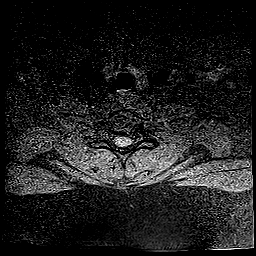
[im 10/26]
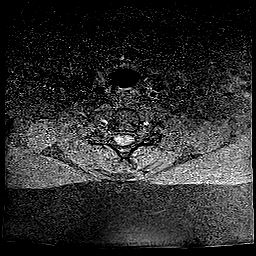
[im 13/26]
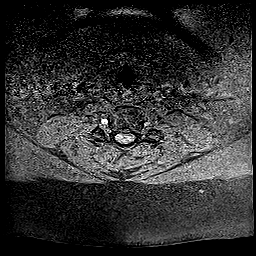
[im 16/26]
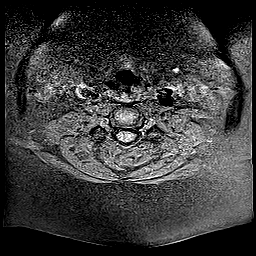
[im 19/26]
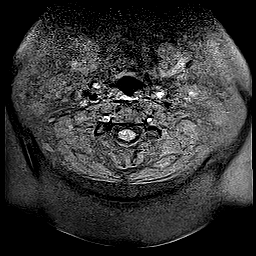
[im 22/26]
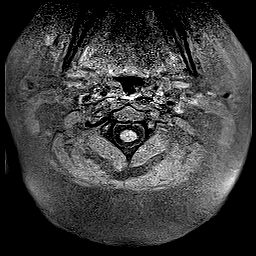
[im 26/26]
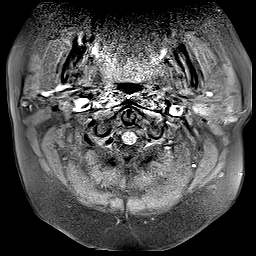

[Series 11: T1 fat-sat · sagittal · 3.0mm · 0.94mm/px · 4 of 13 slices shown (2 of 2)]
[im 1/13]
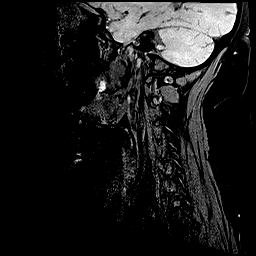
[im 5/13]
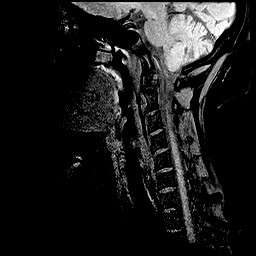
[im 9/13]
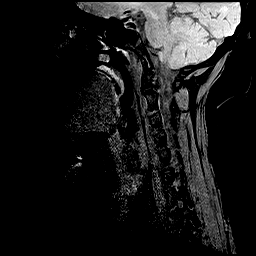
[im 13/13]
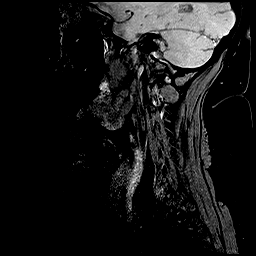

[Series 16: T1 fat-sat post-contrast · axial · 4.0mm · 0.70mm/px · z∈[-73,-59]mm · 2 of 26 slices shown]
[im 1/26]
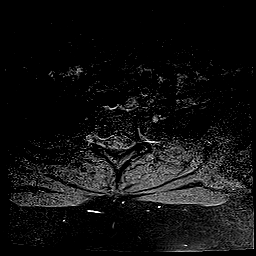
[im 4/26]
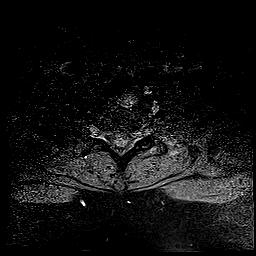

[33 of 48 positions shown; findings below may reference images not displayed]

FINDINGS: Otherwise, this examination appears unremarkable.  There is no definite demyelinating disease or abnormal enhancement.  The two subtle lesions described in a report from a prior scan dated 06/23/2019 are not clearly identified on the current examination.  

There is no fracture or malalignment, significant degenerative change, spinal stenosis, marrow signal alteration, abnormality of the spinal cord, or Chiari malformation.
IMPRESSION: 1. Normal examination.  

:

## 2021-06-14 ENCOUNTER — Other Ambulatory Visit: Payer: Self-pay

## 2021-06-14 ENCOUNTER — Other Ambulatory Visit: Payer: MEDICAID

## 2021-06-14 DIAGNOSIS — G35 Multiple sclerosis: Secondary | ICD-10-CM | POA: Insufficient documentation

## 2021-06-14 LAB — CBC WITH DIFF
BASOPHIL #: 0.1 10*3/uL (ref 0.00–2.50)
BASOPHIL %: 1 % (ref 0–3)
EOSINOPHIL #: 0.3 10*3/uL (ref 0.00–2.40)
EOSINOPHIL %: 4 % (ref 0–7)
HCT: 35.1 % — ABNORMAL LOW (ref 37.0–47.0)
HGB: 11.2 g/dL — ABNORMAL LOW (ref 12.5–16.0)
LYMPHOCYTE #: 1.5 10*3/uL — ABNORMAL LOW (ref 2.10–11.00)
LYMPHOCYTE %: 17 % — ABNORMAL LOW (ref 25–45)
MCH: 21.7 pg — ABNORMAL LOW (ref 27.0–32.0)
MCHC: 31.8 g/dL — ABNORMAL LOW (ref 32.0–36.0)
MCV: 68.1 fL — ABNORMAL LOW (ref 78.0–99.0)
MONOCYTE #: 0.5 10*3/uL (ref 0.00–4.10)
MONOCYTE %: 5 % (ref 0–12)
MPV: 8.2 fL (ref 7.4–10.4)
NEUTROPHIL #: 6.6 10*3/uL (ref 4.10–29.00)
NEUTROPHIL %: 74 % (ref 40–76)
PLATELET COMMENT: ADEQUATE
PLATELETS: 289 10*3/uL (ref 140–440)
RBC COMMENT: NORMAL
RBC: 5.15 10*6/uL (ref 4.20–5.40)
RDW: 20.6 % — ABNORMAL HIGH (ref 11.6–14.8)
WBC: 9 10*3/uL (ref 4.0–10.5)
WBCS UNCORRECTED: 9 10*3/uL

## 2021-06-14 LAB — HEPATIC FUNCTION PANEL
ALBUMIN/GLOBULIN RATIO: 1.3 (ref 0.8–1.4)
ALBUMIN: 4 g/dL (ref 3.5–5.7)
ALKALINE PHOSPHATASE: 67 U/L (ref 34–104)
ALT (SGPT): 16 U/L (ref 7–52)
AST (SGOT): 11 U/L — ABNORMAL LOW (ref 13–39)
BILIRUBIN DIRECT: 0.06 md/dL (ref <=0.20)
BILIRUBIN TOTAL: 0.4 mg/dL (ref 0.3–1.2)
BILIRUBIN, INDIRECT: 0.34 mg/dL (ref <=1)
GLOBULIN: 3.1 (ref 2.9–5.4)
PROTEIN TOTAL: 7.1 g/dL (ref 6.4–8.9)

## 2021-06-18 LAB — VARICELLA-ZOSTER ANTIBODY, IGG, SERUM: VZV IGG QUALITATIVE: POSITIVE

## 2022-02-19 IMAGING — MR MRI BRAIN WITHOUT AND WITH CONTRAST
9 of 13 series · 32 of 48 positions shown · IV contrast (gadavist)
Comparison: Previous MRI brain dated 05/25/2020 and 01/27/2019.

﻿EXAM:  MRI BRAIN WITHOUT AND WITH CONTRAST
INDICATION: Follow-up, multiple sclerosis.  Patient was placed on new medication.
TECHNIQUE: Multiplanar, multisequential MRI of the brain was performed without and with 10 mL of Gadavist.

[Series 5: DWI · axial · 5.0mm · 1.35mm/px · z∈[-48,+78]mm · 8 of 88 slices shown (1 of 3)]
[im 1/88]
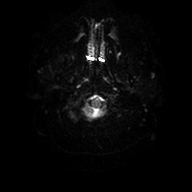
[im 13/88]
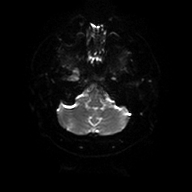
[im 25/88]
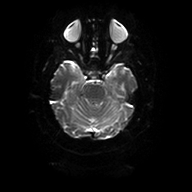
[im 38/88]
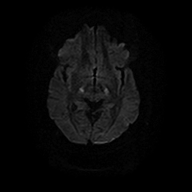
[im 50/88]
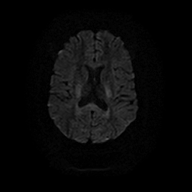
[im 63/88]
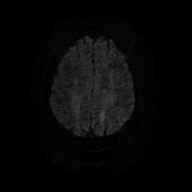
[im 75/88]
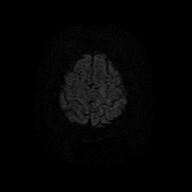
[im 88/88]
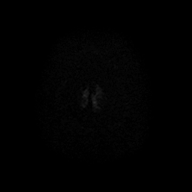

[Series 6: DWI · axial · 5.0mm · 1.35mm/px · 1 of 22 slices shown (2 of 3)]
[im 1/22]
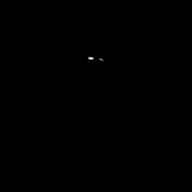

[Series 7: DWI · axial · 5.0mm · 1.35mm/px · z∈[-48,+78]mm · 2 of 21 slices shown (3 of 3)]
[im 1/21]
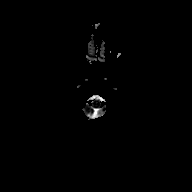
[im 21/21]
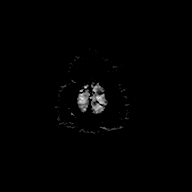

[Series 8: FLAIR · sagittal · 5.0mm · 0.75mm/px · 3 of 32 slices shown (1 of 2)]
[im 1/32]
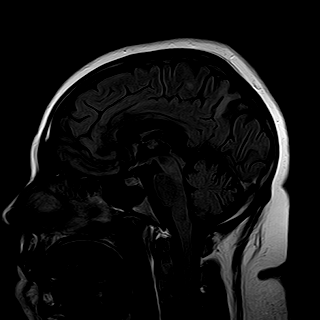
[im 16/32]
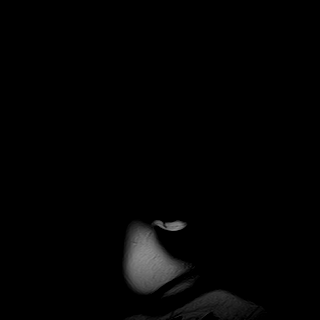
[im 32/32]
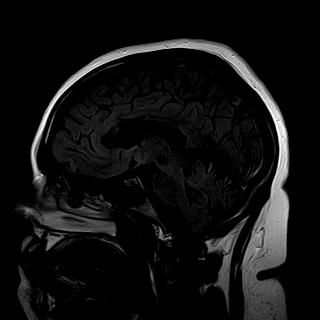

[Series 11: T1 · axial · 4.0mm · 0.69mm/px · z∈[-64,+90]mm · 4 of 36 slices shown]
[im 1/36]
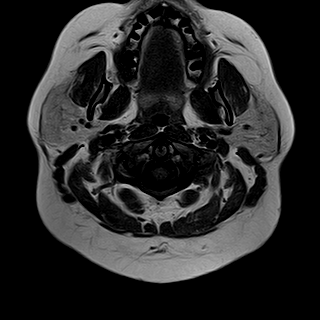
[im 12/36]
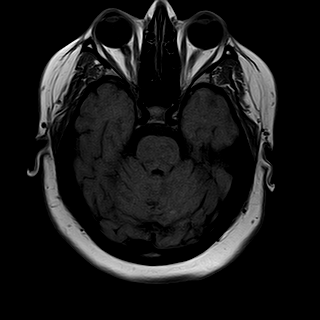
[im 24/36]
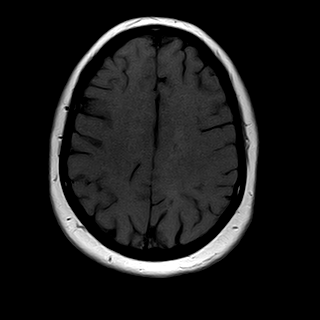
[im 36/36]
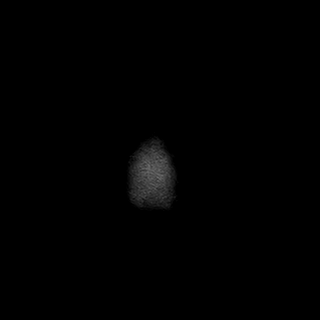

[Series 12: T2 · coronal · 5.0mm · 0.43mm/px · 3 of 28 slices shown]
[im 1/28]
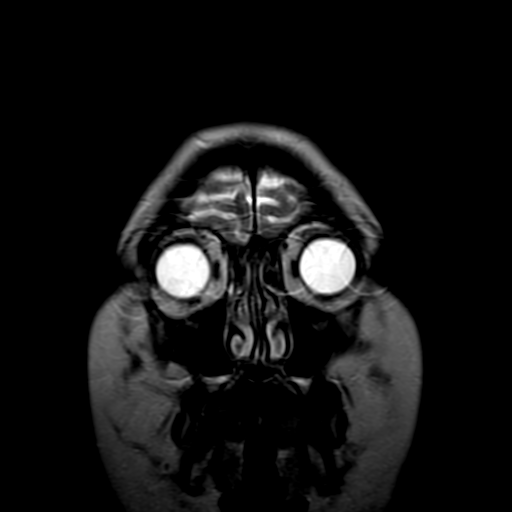
[im 14/28]
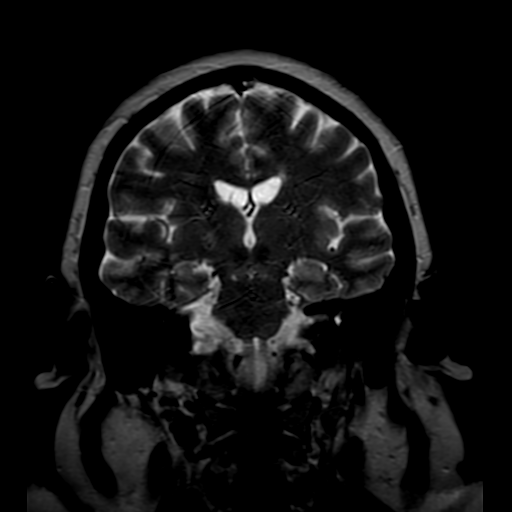
[im 28/28]
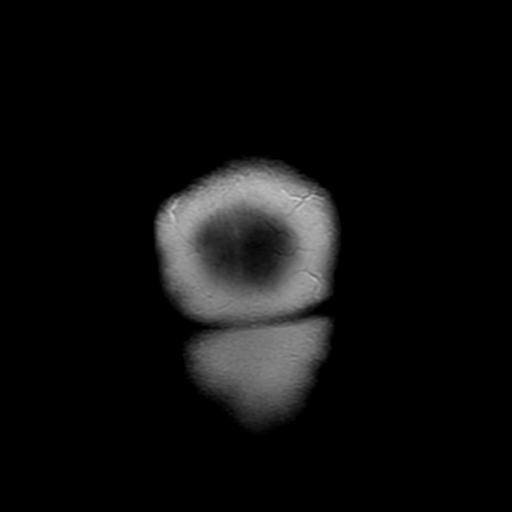

[Series 13: T1 fat-sat · axial · 4.0mm · 0.43mm/px · z∈[-64,+38]mm · 3 of 36 slices shown]
[im 1/36]
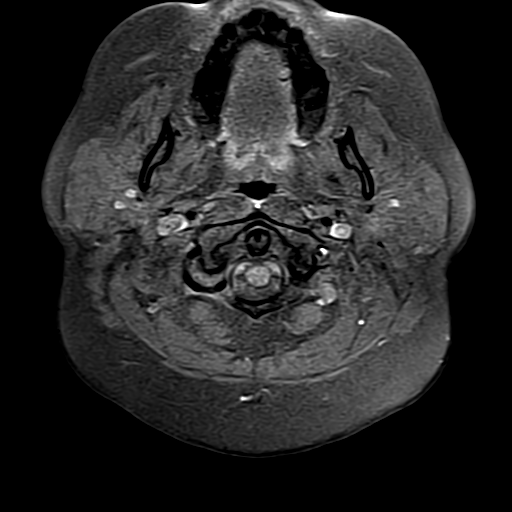
[im 12/36]
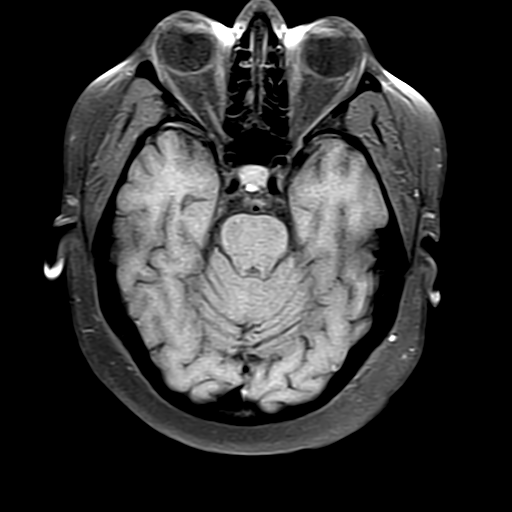
[im 24/36]
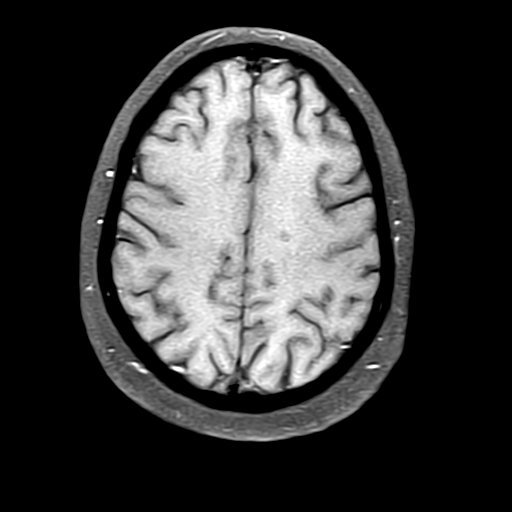

[Series 14: FLAIR · axial · 4.0mm · 0.43mm/px · z∈[-64,+90]mm · 4 of 36 slices shown (2 of 2)]
[im 1/36]
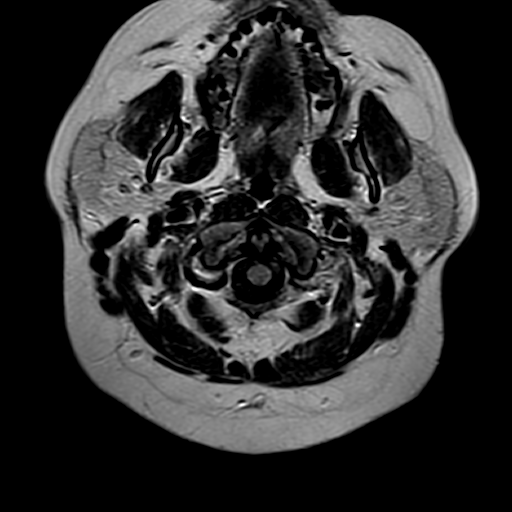
[im 12/36]
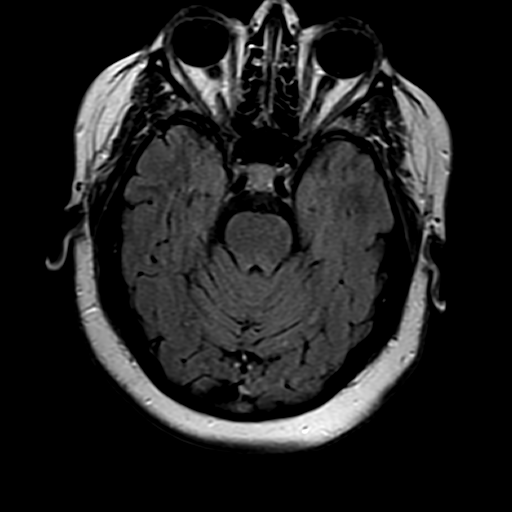
[im 24/36]
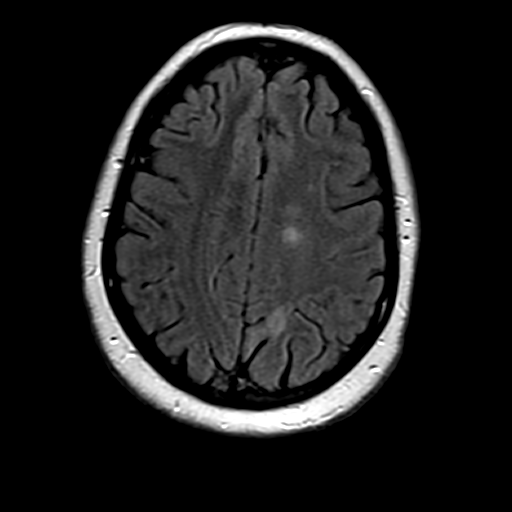
[im 36/36]
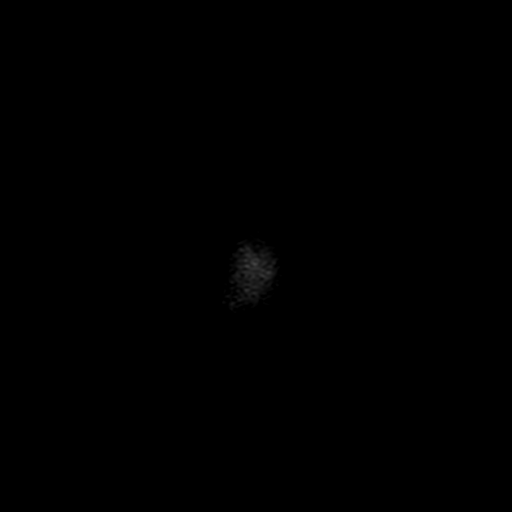

[Series 15: T1 post-contrast · axial · 4.0mm · 0.43mm/px · z∈[-64,+90]mm · 4 of 36 slices shown]
[im 1/36]
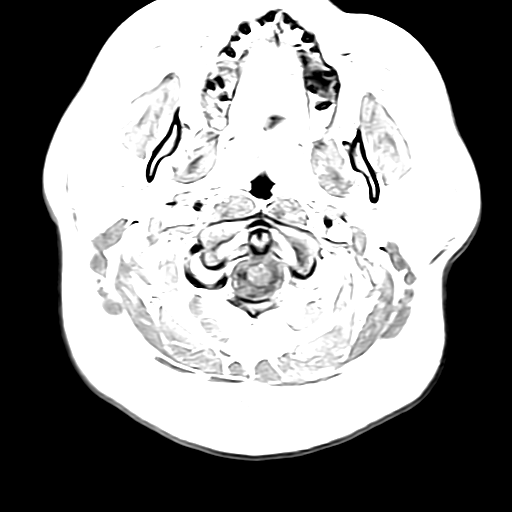
[im 12/36]
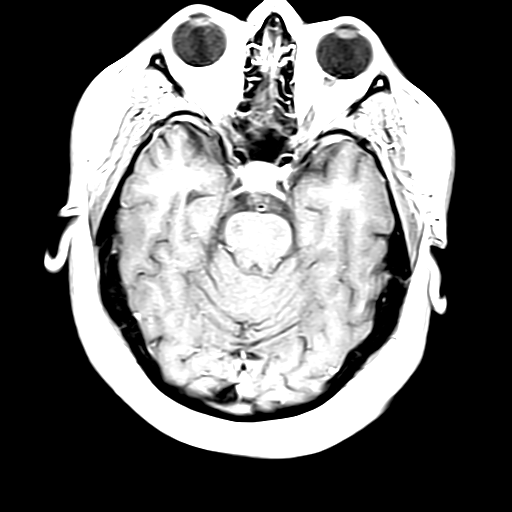
[im 24/36]
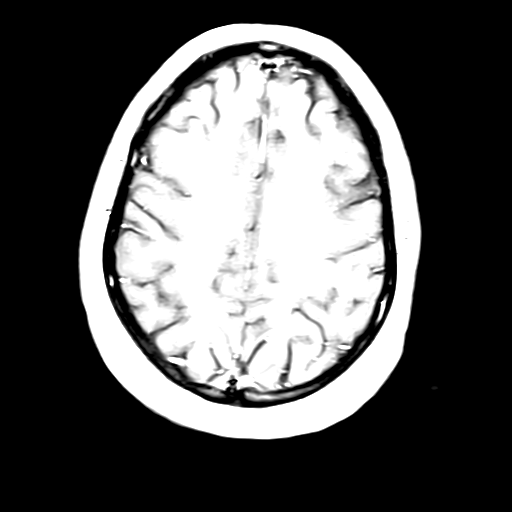
[im 36/36]
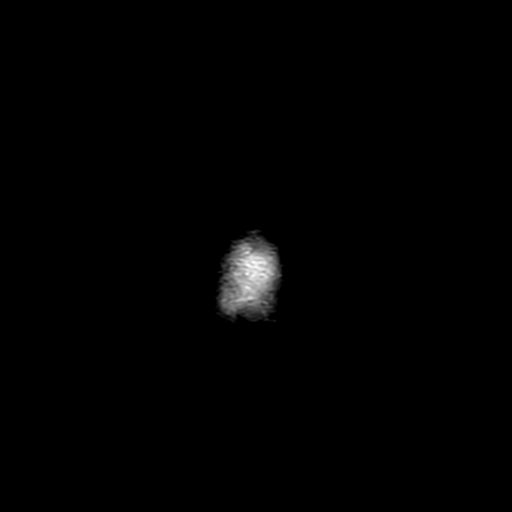

[32 of 48 positions shown; findings below may reference images not displayed]

FINDINGS: No focal areas of restricted diffusion. 

Axial and sagittal FLAIR sequences show a approximately 6-7 FLAIR bright demyelinating lesions of left periventricular and subcortical white matter measuring up to 19 mm in long axis.  Fewer, 4-5 lesions are seen on the right side.  FLAIR bright lesion of the left temporoparietal lobe is also noted.

Ill-defined FLAIR bright lesion of the cerebellum on the left side.  No lesions of the brainstem are seen.

On the postcontrast study solitary enhancing tiny lesion is noted in the subcortical left parietal region, 3-4 mm in size.

No cortical atrophy or ventriculomegaly. No involvement of corpus callosum.
IMPRESSION: 1. There are 6-7 FLAIR bright demyelinating lesions of the left periventricular, subcortical white matter are noted.  Fewer lesions are noted on the right side. Ill-defined FLAIR bright lesion of the left cerebellum is noted.

2. Solitary enhancing lesion is noted in the subcortical left mid parietal region.

3. Compared with prior studies of 05/25/2020 as well as 01/27/2019, there is evidence of definite disease progression with increased number and size of demyelinating lesions. In addition, solitary enhancing lesion on the side indicating active focus.

## 2022-02-22 ENCOUNTER — Other Ambulatory Visit (HOSPITAL_COMMUNITY): Payer: Self-pay | Admitting: PSYCHIATRY AND NEUROLOGY-NEUROLOGY

## 2022-02-22 DIAGNOSIS — G35 Multiple sclerosis: Secondary | ICD-10-CM

## 2022-02-25 ENCOUNTER — Ambulatory Visit
Admission: RE | Admit: 2022-02-25 | Discharge: 2022-02-25 | Disposition: A | Discharge status: Home / Self Care | Payer: MEDICAID | Source: Ambulatory Visit | Attending: PSYCHIATRY AND NEUROLOGY-NEUROLOGY | Admitting: PSYCHIATRY AND NEUROLOGY-NEUROLOGY

## 2022-02-25 ENCOUNTER — Encounter (HOSPITAL_COMMUNITY): Payer: Self-pay

## 2022-02-25 ENCOUNTER — Other Ambulatory Visit: Payer: Self-pay

## 2022-02-25 VITALS — BP 148/91 | HR 55 | Temp 97.0°F | Resp 20

## 2022-02-25 DIAGNOSIS — G35 Multiple sclerosis: Secondary | ICD-10-CM | POA: Insufficient documentation

## 2022-02-25 MED ORDER — SODIUM CHLORIDE 0.9 % INTRAVENOUS SOLUTION
1000.0000 mg | Freq: Once | INTRAVENOUS | Status: AC
Start: 2022-02-25 — End: 2022-02-25
  Administered 2022-02-25: 0 mg via INTRAVENOUS
  Administered 2022-02-25: 1000 mg via INTRAVENOUS
  Filled 2022-02-25: qty 16

## 2022-02-25 NOTE — Nurses Notes (Addendum)
1100 patient ambulated to unit. Patient here for solu-medrol infusion. Audery Amel, RN  1110 VSS. Home medications, and allergies verified with patient. Audery Amel, RN  1115 IV access obtained in right AC, 22g, flushed with normal saline without difficulty. Blood return present. Drssing applie. Audery Amel, RN  1156 solumedrol infusion started. Audery Amel, RN  1500 solumedrol infusion complete. Audery Amel, RN  1506 VSS. No new complaints at this time. Audery Amel, RN  [phone] patient remains with IV access due to future appointments on 02/25/22 and 02/26/22. Flushed with normal saline without difficulty. Blood return present. Gauze wrap with gauze netting applied along with curos.Audery Amel, RN  [phone] patient left floor ambulatory. Audery Amel, RN

## 2022-02-26 ENCOUNTER — Ambulatory Visit (HOSPITAL_COMMUNITY): Payer: MEDICAID

## 2022-02-26 NOTE — Nurses Notes (Signed)
Pt arrived an request we remove iv that she wasn't taking medication anymore that she was nauseated all night an her arm was hurting an she just couldn't take anymore of that medication.iv  was removed pressure dressing applied,no signs of infiltration ,swelling,or redness noted upon removing iv.Francis Dowse, LPN  This note is for 02/26/2022 @ 11am

## 2022-02-27 ENCOUNTER — Ambulatory Visit (HOSPITAL_COMMUNITY): Payer: MEDICAID
